# Patient Record
Sex: Male | Born: 1935
Health system: Southern US, Community
[De-identification: ages and names within clinical notes are randomized; demographics above are authoritative.]

## PROBLEM LIST (undated history)

## (undated) DIAGNOSIS — F0390 Unspecified dementia without behavioral disturbance: Secondary | ICD-10-CM

## (undated) DIAGNOSIS — K219 Gastro-esophageal reflux disease without esophagitis: Secondary | ICD-10-CM

## (undated) DIAGNOSIS — M353 Polymyalgia rheumatica: Secondary | ICD-10-CM

## (undated) DIAGNOSIS — R413 Other amnesia: Secondary | ICD-10-CM

## (undated) DIAGNOSIS — N400 Enlarged prostate without lower urinary tract symptoms: Secondary | ICD-10-CM

## (undated) DIAGNOSIS — M797 Fibromyalgia: Secondary | ICD-10-CM

## (undated) DIAGNOSIS — Z860101 Personal history of adenomatous and serrated colon polyps: Secondary | ICD-10-CM

## (undated) DIAGNOSIS — I4891 Unspecified atrial fibrillation: Secondary | ICD-10-CM

## (undated) DIAGNOSIS — Z86718 Personal history of other venous thrombosis and embolism: Secondary | ICD-10-CM

## (undated) DIAGNOSIS — Z79899 Other long term (current) drug therapy: Secondary | ICD-10-CM

## (undated) DIAGNOSIS — C679 Malignant neoplasm of bladder, unspecified: Secondary | ICD-10-CM

## (undated) DIAGNOSIS — E785 Hyperlipidemia, unspecified: Secondary | ICD-10-CM

## (undated) DIAGNOSIS — R4701 Aphasia: Secondary | ICD-10-CM

## (undated) DIAGNOSIS — E039 Hypothyroidism, unspecified: Secondary | ICD-10-CM

## (undated) DIAGNOSIS — K5900 Constipation, unspecified: Secondary | ICD-10-CM

## (undated) DIAGNOSIS — N401 Enlarged prostate with lower urinary tract symptoms: Secondary | ICD-10-CM

## (undated) DIAGNOSIS — I2699 Other pulmonary embolism without acute cor pulmonale: Secondary | ICD-10-CM

## (undated) DIAGNOSIS — Z86010 Personal history of colonic polyps: Secondary | ICD-10-CM

## (undated) DIAGNOSIS — D649 Anemia, unspecified: Secondary | ICD-10-CM

## (undated) DIAGNOSIS — R519 Headache, unspecified: Secondary | ICD-10-CM

## (undated) HISTORY — DX: Other long term (current) drug therapy: Z79.899

## (undated) HISTORY — DX: Benign prostatic hyperplasia with lower urinary tract symptoms: N40.1

## (undated) HISTORY — DX: Polymyalgia rheumatica: M35.3

## (undated) HISTORY — DX: Other amnesia: R41.3

## (undated) HISTORY — DX: Headache, unspecified: R51.9

## (undated) HISTORY — DX: Personal history of colonic polyps: Z86.010

## (undated) HISTORY — DX: Unspecified atrial fibrillation: I48.91

## (undated) HISTORY — DX: Anemia, unspecified: D64.9

## (undated) HISTORY — DX: Benign prostatic hyperplasia without lower urinary tract symptoms: N40.0

## (undated) HISTORY — DX: Constipation, unspecified: K59.00

## (undated) HISTORY — DX: Unspecified dementia, unspecified severity, without behavioral disturbance, psychotic disturbance, mood disturbance, and anxiety: F03.90

## (undated) HISTORY — DX: Hyperlipidemia, unspecified: E78.5

## (undated) HISTORY — DX: Personal history of adenomatous and serrated colon polyps: Z86.0101

## (undated) HISTORY — DX: Malignant neoplasm of bladder, unspecified: C67.9

## (undated) HISTORY — DX: Gastro-esophageal reflux disease without esophagitis: K21.9

## (undated) HISTORY — PX: IVC FILTER INSERTION: CATH118245

## (undated) HISTORY — PX: TRANSURETHRAL RESECTION OF BLADDER TUMOR WITH GYRUS (TURBT-GYRUS): SHX6458

---

## 1998-02-02 ENCOUNTER — Ambulatory Visit (HOSPITAL_COMMUNITY): Admission: RE | Admit: 1998-02-02 | Discharge: 1998-02-02 | Payer: Self-pay | Admitting: Gastroenterology

## 2000-02-07 ENCOUNTER — Encounter (INDEPENDENT_AMBULATORY_CARE_PROVIDER_SITE_OTHER): Payer: Self-pay | Admitting: Specialist

## 2000-02-07 ENCOUNTER — Other Ambulatory Visit: Admission: RE | Admit: 2000-02-07 | Discharge: 2000-02-07 | Payer: Self-pay | Admitting: Urology

## 2001-07-09 ENCOUNTER — Encounter: Payer: Self-pay | Admitting: Emergency Medicine

## 2001-07-09 ENCOUNTER — Encounter: Admission: RE | Admit: 2001-07-09 | Discharge: 2001-07-09 | Payer: Self-pay | Admitting: Emergency Medicine

## 2001-09-06 ENCOUNTER — Observation Stay (HOSPITAL_COMMUNITY): Admission: RE | Admit: 2001-09-06 | Discharge: 2001-09-06 | Payer: Self-pay | Admitting: Urology

## 2001-09-06 ENCOUNTER — Encounter (INDEPENDENT_AMBULATORY_CARE_PROVIDER_SITE_OTHER): Payer: Self-pay | Admitting: Specialist

## 2002-04-18 HISTORY — PX: ACHILLES TENDON REPAIR: SUR1153

## 2002-04-24 ENCOUNTER — Ambulatory Visit (HOSPITAL_BASED_OUTPATIENT_CLINIC_OR_DEPARTMENT_OTHER): Admission: RE | Admit: 2002-04-24 | Discharge: 2002-04-24 | Payer: Self-pay | Admitting: Orthopedic Surgery

## 2002-06-03 ENCOUNTER — Encounter: Admission: RE | Admit: 2002-06-03 | Discharge: 2002-06-03 | Payer: Self-pay

## 2002-06-04 ENCOUNTER — Inpatient Hospital Stay (HOSPITAL_COMMUNITY): Admission: EM | Admit: 2002-06-04 | Discharge: 2002-06-11 | Payer: Self-pay | Admitting: Orthopaedic Surgery

## 2002-06-24 ENCOUNTER — Inpatient Hospital Stay (HOSPITAL_COMMUNITY): Admission: AD | Admit: 2002-06-24 | Discharge: 2002-06-28 | Payer: Self-pay | Admitting: Vascular Surgery

## 2002-06-27 ENCOUNTER — Encounter (INDEPENDENT_AMBULATORY_CARE_PROVIDER_SITE_OTHER): Payer: Self-pay | Admitting: Specialist

## 2003-02-02 ENCOUNTER — Emergency Department (HOSPITAL_COMMUNITY): Admission: EM | Admit: 2003-02-02 | Discharge: 2003-02-02 | Payer: Self-pay | Admitting: Emergency Medicine

## 2006-03-06 ENCOUNTER — Inpatient Hospital Stay (HOSPITAL_COMMUNITY): Admission: EM | Admit: 2006-03-06 | Discharge: 2006-03-14 | Payer: Self-pay | Admitting: Emergency Medicine

## 2006-03-07 ENCOUNTER — Encounter: Payer: Self-pay | Admitting: Vascular Surgery

## 2006-03-07 ENCOUNTER — Encounter (INDEPENDENT_AMBULATORY_CARE_PROVIDER_SITE_OTHER): Payer: Self-pay | Admitting: Cardiology

## 2008-03-20 ENCOUNTER — Encounter: Admission: RE | Admit: 2008-03-20 | Discharge: 2008-03-20 | Payer: Self-pay | Admitting: Family Medicine

## 2010-03-16 ENCOUNTER — Ambulatory Visit (HOSPITAL_BASED_OUTPATIENT_CLINIC_OR_DEPARTMENT_OTHER): Admission: RE | Admit: 2010-03-16 | Discharge: 2010-03-16 | Payer: Self-pay | Admitting: Urology

## 2010-05-09 ENCOUNTER — Encounter: Payer: Self-pay | Admitting: Cardiology

## 2010-05-13 ENCOUNTER — Other Ambulatory Visit: Payer: Self-pay | Admitting: Gastroenterology

## 2010-09-03 NOTE — Discharge Summary (Signed)
NAME:  Gilbert Clark, Gilbert Clark                    ACCOUNT NO.:  [id number]   MEDICAL RECORD NO.:  [id number]                   PATIENT TYPE:  INP   LOCATION:  0443                                 FACILITY:  [facility]   PHYSICIAN:  Mark C. Ophelia Charter, M.D.                 DATE OF BIRTH:  26-Feb-1936   DATE OF ADMISSION:  06/04/2002  DATE OF DISCHARGE:  06/11/2002                                 DISCHARGE SUMMARY   FINAL DIAGNOSES:  1. Lower extremity venous thrombosis.  2. History of bladder malignancy.  3. Hypothyroidism and OS.  4. Hypercholesterolemia.   HISTORY OF PRESENT ILLNESS:  A 75 year old white male was admitted to [facility] June 04, 2002 after being seen in our office with  complaints of left lower extremity pain and swelling.  He was status post  Achilles tendon repair in January 2004 by Nadara Mustard, M.D.  Pain and  swelling progressively worsened over the past several weeks prior to  admission.  Venous Doppler showed a thrombus from iliac through CFV, SFV,  popliteal, and tibial veins.   ADMISSION LABORATORIES:  PT 13.6, INR 1.0, PTT 30.  Na 138, K 5.1, Cl 107,  CO2 22, glucose 129, BUN 28, creatinine 1.4, calcium 10.1, TP 7.4, albumin  3.5, AST 23, ALT 22, ALP 83, Tbili 0.7.  Urinalysis showed a few bacteria  with granular casts.   HOSPITAL COURSE:  On June 04, 2002 patient was admitted to [facility] and started on heparin and Coumadin pharmacy protocol.  He was seen  by his neurologist to discuss pending neurology procedure.  June 05, 2002 patient had good pain control.  Vital signs stable, afebrile.  WBC  12.9, hemoglobin 13.4.  PTT 103, PT 13.7, INR 1.0.  Started on Flexeril 10  mg p.o. t.i.d. p.r.n. for spasms.  Evaluated by care management.  June 06, 2002 good pain control.  Vital signs stable, afebrile.  PTT 87, PT 13.6,  INR 1.0.  Left lower extremity swelling and firmness decreased.  DP pulses  palpable.  Orders given for  nonweightbearing left lower extremity.  June 07, 2002 swelling and pain continue to decrease.  June 08, 2002 vital  signs stable, afebrile.  INR 1.3, PTT 107.  Calf and thigh soft.  June 09, 2002 INR 1.8.  Continue heparin and Coumadin until INR greater than 2.0.  June 10, 2002 patient doing well.  Continues to improve and ready for  discharge June 11, 2002.   DISPOSITION:  Discharged home June 11, 2002.   DISCHARGE MEDICATIONS:  1. Coumadin 10 mg p.o. daily.  2. Resume previous home medications.   CONDITION ON DISCHARGE:  Stable and improved.    DISCHARGE INSTRUCTIONS:  The patient will continue to keep left lower  extremity elevated as much as possible.  He will wear a fracture boot while  he is up and ambulating on crutches.  Touchdown weightbearing.  Also, follow  up in one week for a recheck.  Repeat Doppler in two weeks.  Will remain on  Coumadin for DVT for the next several weeks.     Genene Churn. Denton Meek.                      Mark C. Ophelia Charter, M.D.    JMO/MEDQ  D:  07/30/2002  T:  07/30/2002  Job:  [id number]

## 2010-09-03 NOTE — Op Note (Signed)
[facility]  Patient:    [name], Gilbert Clark Visit Number: [id number] MRN: [id number]          Service Type: OBV Location: 3W 0343 01 Attending Physician:  Evlyn Clines Dictated by:   Excell Seltzer. Annabell Howells, M.D. Proc. Date: 09/06/01 Admit Date:  09/06/2001 Discharge Date: 09/06/2001   CC:         Reuben Likes, M.D.   Operative Report  PREOPERATIVE DIAGNOSIS:  Bladder tumor of the posterior wall measuring 4 cm.  POSTOPERATIVE DIAGNOSIS:  Bladder tumor of the posterior wall measuring 4 cm.  PROCEDURE:  Transurethral resection of bladder tumor with instillation of mitomycin C.  SURGEON:  John J. Annabell Howells, M.D.  ANESTHESIA:  General.  DRAINS:  A 22-French Foley.  SPECIMENS:  Tumor.  COMPLICATIONS:  None.  INDICATIONS:  The patient is a 75 year old white male who had an episode of gross hematuria.  He has had a long history of microhematuria with previous negative evaluation.  With the gross hematuria episode, he underwent a repeat evaluation without IVP and cystoscopy, and this demonstrated on the posterior wall of the bladder, a papillary transitional cell carcinoma with probable carcinoma in situ, measuring approximately 4 cm in greatest diameter.  FINDINGS AND DESCRIPTION OF PROCEDURE:  The patient was given p.o. Tequin.  He was taken to the operating room where a general anesthetic was induced.  He was placed in the lithotomy position.  His perineum and genitalia were prepped with Betadine solution and he was draped in the usual sterile fashion.  The urethra was calibrated to 30-French with Sissy Hoff sounds and a 28-French continuous flow resectoscope sheath was inserted.  This was initially fitted with a 70 degree lens.  Inspection revealed mild trabeculation of the bladder, a tumor on the posterior wall.  The ureteral orifices were well-away from from the tumor in the normal anatomic position.  The 70-French scope was then replaced  with an Wandra Scot handle 12 degree lens and 26 loupe.  Inspection of the prostate revealed bilobar hyperplasia with some elevation in the median bar with coaptation and some degree of obstruction.  The tumor was once again identified and photographed, and then resected. with pure cut.  Once the entire tumor and surrounding erythema suspicious for carcinoma in situ had been resected, the bladder was evacuated free of chips.  The base of the tumor was then fulgurated as well as surrounding mucosa for approximately 1 cm.  At this point, the bladder was reinspected, and there was no evidence of bladder wall perforation or ureteral injury noted.  The bladder was free of chips and clots.  No active bleeding was noted.  The scope was removed and a 22-French Foley catheter was inserted.  The balloon was filled with 10 cc of sterile fluid.  The bladder was drained and then 40 cc of solution containing 40 mg of mitomycin C were instilled.  The catheter was plugged.  It will remain plugged for 30 minutes.  The patient was then taken down from the lithotomy position and his anesthetic was reversed.  He was moved to the recovery room in stable condition.  There were no complications. Dictated by:   Excell Seltzer. Annabell Howells, M.D. Attending Physician:  Evlyn Clines DD:  09/06/01 TD:  09/09/01 Job: [id number]

## 2010-09-03 NOTE — Discharge Summary (Signed)
NAME:  Gilbert Clark, Gilbert Clark NO.:  [id number]   MEDICAL RECORD NO.:  [id number]                   PATIENT TYPE:  OBV   LOCATION:  0373                                 FACILITY:  [facility]   PHYSICIAN:  Excell Seltzer. Annabell Howells, M.D.                 DATE OF BIRTH:  [birth year]   DATE OF ADMISSION:  06/27/2002  DATE OF DISCHARGE:  06/28/2002                                 DISCHARGE SUMMARY   DATE OF ADMISSION:  The patient was admitted on June 24, 2002 to Dr. Josephina Gip and transferred to [facility] on June 27, 2002.   DISCHARGE DATE:  Patient discharged home on June 28, 2002.   HISTORY OF PRESENT ILLNESS:  Briefly, the patient is a 75 year old white  male who has a history of bladder tumors and has had a recurrence.  His  condition has been complicated by a left deep venous thrombosis following a  ruptured tendon.  He had been on Coumadin therapy but was admitted for a  vena cava filter placement by Dr. Jerilee Field to be followed by a  transurethral resection of bladder tumor. For additional details of the  history and physical examination please see the note dictated by Dr. Hart Rochester  on June 24, 2002.   HOSPITAL COURSE:  On the day of his admission to the hospital, he was taken  off his Coumadin and put on heparin protocol and on June 26, 2002 was taken  to the operating room where Dr. Hart Rochester did vena cava filter placement.  On  June 27, 2002 he was transferred to [facility] where he underwent  cystoscopy, bilateral retrograde pyelograms, transurethral resection of  median bladder tumor.  Postoperatively the patient did well, was left with a  Foley catheter.  The Foley catheter was removed on June 28, 2002.  He was  able to void without difficulty and was felt to be ready for discharge home.  He was discharged home with the following discharge diagnoses:   DISCHARGE DIAGNOSES:  1. Transitional cell carcinoma of the bladder.  2. Deep venous  thrombosis requiring vena cava filter.   COMPLICATIONS:  There were no complications during the patient's admission.   FINAL PATHOLOGY:  Low grade, noninvasive papillary transitional cell  carcinoma.   DISPOSITION:  The patient is discharged to home.   CONDITION ON DISCHARGE:  His condition on discharge is improved.   PROGNOSIS:  Good.    DISCHARGE INSTRUCTIONS:  The patient was instructed to follow up with Dr.  Annabell Howells in one to two weeks.   DISCHARGE MEDICATIONS:  Patient is discharged home with prescriptions for  Vicodin and Bactrim.  Excell Seltzer. Annabell Howells, M.D.    JJW/MEDQ  D:  08/22/2002  T:  08/22/2002  Job:  [id number]   cc:   Quita Skye. Hart Rochester, M.D.  [address]  [place]  [place] [address]  Fax: [phone]

## 2010-09-03 NOTE — H&P (Signed)
NAME:  Gilbert Clark, Raden.:  [id number]   MEDICAL RECORD NO.:  [id number]                   PATIENT TYPE:  INP   LOCATION:                                       FACILITY:  [facility]   PHYSICIAN:  Quita Skye. Hart Rochester, M.D.               DATE OF BIRTH:  [birth year]   DATE OF ADMISSION:  06/24/2002  DATE OF DISCHARGE:                                HISTORY & PHYSICAL   CHIEF COMPLAINT:  Recent deep venous thrombosis left leg that now requires  bladder surgery.  Admitted for vena cava filter.   HISTORY OF PRESENT ILLNESS:  This 75 year old gentleman underwent repair of  a ruptured Achilles tendon by Dr. Aldean Baker about eight weeks ago.  On  February 17 he developed a swollen left thigh and calf and was found to have  acute deep venous thrombosis by duplex scanning involving his left femoral,  superficial femoral, popliteal and tibial veins.  He was admitted for  heparinization and converted to Coumadin therapy.  He has been taking  Coumadin at home with slow improvement in the edema of the left leg since  that time.  Most recently he was taking 10 mg per day with an INR of 3.7 and  a prothrombin time of 30 seconds yesterday (March 1).  He had an episode of  right sided chest pain prior to his diagnosis of deep venous thrombosis and  a question of whether he could have had a right sided pulmonary embolus was  entertained although he had no tests to document this.  He now needs  cystoscopy and surgery for a localized bladder cancer by Dr. Bjorn Pippin and  is admitted for insertion of vena caval filter.   PAST MEDICAL HISTORY:  Negative for coronary artery disease, CVA, diabetes  mellitus, deep venous thrombosis in the past, chronic obstructive pulmonary  disease.   PAST SURGICAL HISTORY:  Achilles tendon repair, his previous cystoscopy for  bladder tumor removal, and liposuction surgery.   FAMILY HISTORY:  Negative for coronary artery disease, positive for  stroke.   SOCIAL HISTORY:  The patient does not smoke, has not for the past 35 years.   MEDICATIONS:  1. Zocor 40 mg daily.  2. Synthroid 100 mcg p.o. daily.  3. Coumadin 10 mg daily, now decreased to 5 mg daily.  4. Vitamin C 500 mg p.o. daily.  5. Multivitamin one p.o. daily.  6. Folic acid 44 mg p.o. daily.  7. Benadryl 25 mg p.o. q.h.s.   ALLERGIES:  EPINEPHRINE.   REVIEW OF SYSTEMS:  Denies any GI, GU, lower extremity or neurologic  symptoms.  No history of pulmonary problems such as emphysema, asthma,  wheezing, dyspnea on exertion, PND, orthopnea or other cardiac symptoms.   PHYSICAL EXAMINATION:  VITALS:  Blood pressure is 122/72, heart rate 80,  respirations 16.  GENERAL:  Healthy appearing male patient who is in no apparent distress.  He  is alert and oriented x3.  NECK:  Supple, 3+ carotid pulses.  No bruits are audible.  NEUROLOGIC:  Normal.  CHEST:  Clear to auscultation.  CARDIOVASCULAR:  Reveals regular rhythm, no murmurs.  ABDOMEN:  Soft, nontender with no masses.  EXTREMITIES:  He has 3+ femoral and popliteal pulses on the right with a 2+  dorsalis pedis pulse.  The left leg has a 3+ femoral pulse.  He has a soft  cast on from the knee to the ankle on the left.  Left thigh is slightly  swollen compared to the right.   IMPRESSION:  1. Recent episode of acute ileofemoral deep venous thrombosis on the left.  2. Recent repair of ruptured Achilles tendon.  3. History of bladder cancer - recurrence.   PLAN:  Will admit on Monday, March 8 after two days off of Coumadin therapy  and convert to IV heparin and when Coumadin has been reversed proceed with  insertion of inferior vena caval filter in preparation for his bladder  surgery.                                               Quita Skye Hart Rochester, M.D.    JDL/MEDQ  D:  06/18/2002  T:  06/18/2002  Job:  [id number]   cc:   Maye Hides, M.D.   Nadara Mustard, M.D.  [address]  [place]  [place]  [address]  Fax: [phone]   Excell Seltzer. Annabell Howells, M.D.  [address]  [place]  [place] [address]  Fax: [phone]

## 2010-09-03 NOTE — Op Note (Signed)
   NAME:  Gilbert Clark, Gilbert Clark                    ACCOUNT NO.:  [id number]   MEDICAL RECORD NO.:  [id number]                   PATIENT TYPE:  AMB   LOCATION:  DAY                                  FACILITY:  [facility]   PHYSICIAN:  Quita Skye. Hart Rochester, M.D.               DATE OF BIRTH:  22-Sep-1935   DATE OF PROCEDURE:  06/26/2002  DATE OF DISCHARGE:                                 OPERATIVE REPORT   PREOPERATIVE DIAGNOSIS:  Left leg iliofemoral deep venous thrombosis with  need for bladder surgery.   POSTOPERATIVE DIAGNOSIS:  Left leg iliofemoral deep venous thrombosis with  need for bladder surgery.   OPERATION/PROCEDURE:  Insertion of a Cordis trapeze inferior vena caval  filter via right common femoral approach.   SURGEON:  Quita Skye. Hart Rochester, M.D.   ANESTHESIA:  Local Xylocaine, Versed 2 mg intravenously.   COMPLICATIONS:  None.   DESCRIPTION OF PROCEDURE:  The patient was taken to the [facility]  Peripheral Vascular Lab, placed in the supine position at which time the  right groin was prepped with Betadine solution and draped in the routine  sterile manner.  After infiltration with 1% Xylocaine, the right common  femoral vein was entered percutaneously, guide wire passed into the inferior  vena cava via fluoroscopic guidance.  The Cordis trapeze vena cava filter  device was then inserted over the guide wire with the dilator within the  sheath being passed to the distal inferior vena cava.  The dilator was  removed.  Inferior vena cavogram was performed injecting 35 cc of contrast  at 18 cc/second.  This revealed the location of the renal veins at about the  L1 interspace and this had been marked with a hemostat.  The diameter of the  inferior vena cava below the renal veins was approximately 18 mm.  The  dilator was then reinserted over the guide wire and the sheath advanced to  the appropriate position at the L2 interspace and the trapeze inferior vena  cava filter was then  positioned appropriately.  The sheath was then  retracted deploying the filter in the appropriate position which was  confirmed fluoroscopically.  The sheath and obturator were then removed.  Adequate compression applied.  No complications ensured.   CONCLUSION:  Successful placement of trapeze inferior vena caval filter at  the L2 interspace via right common femoral vein approach.                                               Quita Skye Hart Rochester, M.D.    JDL/MEDQ  D:  06/26/2002  T:  06/26/2002  Job:  [id number]

## 2010-09-03 NOTE — Discharge Summary (Signed)
NAME:  Gilbert Clark, Gilbert Clark NO.:  [id number]   MEDICAL RECORD NO.:  [id number]          PATIENT TYPE:  INP   LOCATION:  2020                         FACILITY:  [facility]   PHYSICIAN:  Madaline Savage, M.D.DATE OF BIRTH:  06/02/1935   DATE OF ADMISSION:  03/06/2006  DATE OF DISCHARGE:  03/14/2006                                 DISCHARGE SUMMARY   DISCHARGE DIAGNOSES:  1. Deep vein thrombosis this admission, treated with thrombectomy and      thrombolytics on March 08, 2006.  2. Blood loss anemia this admission, transfused two units on March 11, 2006.  3. Contrast nephropathy with a peak creatinine of 2.4.  Creatinine 2.2 at      discharge.  4. History of bladder cancer, followed by Dr. Annabell Howells.  5. Previous deep vein thrombosis in 2004, with a history of an IVC filter      placement.  6. Hypothyroidism, treated, with an adjustment his Synthroid this      admission.   HOSPITAL COURSE:  The patient is a 75 year old male followed by Dr. Elsie Lincoln  and Dr. Annabell Howells.  He has a history of DVT, when he had bladder cancer in 2004.  At that time, he was also given an IVC filter.  The patient's bladder cancer  has been stable, apparently he had a cystoscopy in September 2007, that was  okay.  His PSA is slightly elevated but Dr. Annabell Howells feels this is stable.  The  patient was admitted March 06, 2006, with a 2-week history of pain in his  legs and swelling.  He went to [facility] for a CT and  the scan revealed an extensive thrombus of the femoral vein, iliac veins,  and IVC.  Chest CT did not reveal pulmonary embolism.  The patient was  admitted to telemetry, started on IV heparin.  His creatinine on admission  was 1.4.  Dr. Elsie Lincoln reviewed the CT scan of the abdomen and pelvis.  The  [place] filter that had previously been placed was completely filled with  thrombus and almost totally occluded.  After discussion with Dr. Alanda Amass  and Dr. Allyson Sabal, we  asked interventional radiology to see the patient and on  March 08, 2006, he underwent lytic therapy and thrombectomy.  He was kept  on __________  for 24 hours.  The patient was also seen by Dr. Annabell Howells, who  felt that the DVTs were not likely related to a urologic issue.  Dr. Annabell Howells  will see him seen in followup as an outpatient.  The patient did have a bump  in his creatinine post-op to 2.4 peak, at discharge, this is 2.2.  Dr.  Elsie Lincoln felt this was probably secondary to contrast used during the  venogram.  He did drop his hemoglobin postprocedure to 8.2 and was  transfused 2 units packed RBCs.  Echocardiogram done during this admission  revealed normal LV function with an EF of 60%.  Right ventricle was at upper  limits of normal.  Right atrium size was normal.  Coumadin was started.  Dr.  Elsie Lincoln feels the patient can  be discharged March 14, 2006.   Coumadin dose at discharge will be 12.5 mg a day.  He will have daily  proTime checks.   He is to see Dr. Elsie Lincoln in the office Thursday of this week.   1. He will go home on Lovenox 100 mg subcu q.12 h.  2. Other medications at discharge include Zantac 150 mg a day.  3. Vytorin 10/80 every day.  4. Lexapro 10 mg a day.  5. Flomax 0.4 mg a day.  6. Synthroid has been increased to 0.137 mg a day.  7. Aspirin 81 mg a day.  8. Iron b.i.d.  9. Fish oil and multivitamin once a day.   LABORATORY:  White count 6, hemoglobin 9.6, hematocrit 27.6, platelets 170.  Sodium 137, potassium 3.5, BUN 12, creatinine 2.2, calcium 8.3.  Fibrinogen  311.  Blood type is B+, antibody negative.  Fibrinogen level is 152 on the  22nd.  PSA was 4.58.  Antithrombin III was 116 which is within normal  limits.  Venous study done March 07, 2006, showed chronic nonobstructive  DVT in the profunda vein on the right.  No evidence of DVT in the left,  although the distal common femoral vein may have thickened walls.  A UA was  negative.  INR at discharge is  1.4.  Telemetry shows sinus rhythm.  EKG  shows sinus rhythm, poor anterior R-wave progression, and septal Qs.   DISPOSITION:  The patient is discharged in stable condition.  He will have  daily proTime checks.  He is to see Dr. Elsie Lincoln later this week on Thursday.  He will need a followup TSH as his Synthroid was increased this admission.  His TSH on the 19th, was 11.48.      Abelino Derrick, P.A.    ______________________________  Madaline Savage, M.D.    Lenard Lance  D:  03/14/2006  T:  03/14/2006  Job:  [id number]   cc:   Pam Drown, M.D.  Excell Seltzer. Annabell Howells, M.D.

## 2010-09-03 NOTE — H&P (Signed)
NAME:  Gilbert Clark, Gilbert Clark                    ACCOUNT NO.:  [id number]   MEDICAL RECORD NO.:  [id number]                   PATIENT TYPE:  INP   LOCATION:  0443                                 FACILITY:  [facility]   PHYSICIAN:  Mark C. Ophelia Charter, M.D.                 DATE OF BIRTH:  05/30/1935   DATE OF ADMISSION:  06/04/2002  DATE OF DISCHARGE:                                HISTORY & PHYSICAL   CHIEF COMPLAINT:  Left lower extremity pain and swelling.   HISTORY OF PRESENT ILLNESS:  The patient is a 75 year old white male who is  admitted to [name] for left lower extremity DVT, pain, and swelling.  He is status post Achilles tendon repair January 2004 by  Dr. Lajoyce Corners.  Over  the past several days, he has had an increased pain and swelling from the  upper thigh down to his left foot.  Increased pain with ambulation.  The  patient denies any previous history of blood clot.   CURRENT MEDICATIONS:  1. Zocor 40 mg p.o. once daily.  2. Synthroid 100 mcg p.o. once daily.  3. Vitamin C 500 mg p.o. once daily.  4. ASA 81 mg p.o. once daily.  5. Folic acid 44 mg p.o. once daily.  6. Benadryl 25 mg p.o. at bedtime p.r.n. for sleep.  7. Multivitamin one tab p.o. once daily.   ALLERGY:  EPINEPHRINE.   PAST MEDICAL/SURGICAL HISTORY:  1. Liposuction May 2000.  2. Left Achilles tendon repair January 2004.  3. Bladder excisional biopsy May 2003.  4. Hypercholesterolemia.  5. Hypothyroidism.  6. History of bladder cancer.  7. BPH/prostatitis.  8. Recent history of pleurisy x2 weeks ago treated with a 10-day course of     antibiotics.   SOCIAL HISTORY:  The patient is married.  Quit smoking x35 years ago.  Admits to occasional ETOH.   FAMILY HISTORY:  Positive for CVA.   REVIEW OF SYSTEMS:  Positive nausea.  Positive nonproductive cough.  Currently denies chest pain or shortness of breath, abdominal pain, fever,  chills, night sweats, bleeding disorders, seizure disorders.  All other  systems noncontributory.   PHYSICAL EXAMINATION:  VITAL SIGNS:  Temperature 96.8, pulse 84,  respirations 20, blood pressure 110/73, height 73 inches, weight 220 pounds.  GENERAL:  Pleasant white male who is alert and oriented x3 in no acute  distress.  HEENT:  Head is normocephalic, atraumatic.  PERRLA and EOMI.  Oral mucosa  pink and moist.  NECK:  Full range of motion.  Supple, nontender.  Carotids are intact.  No  lymphadenopathy.  LUNGS:  CTA bilaterally.  No wheezes, rales, or rhonchi noted.  HEART:  RRR.  S1, S2.  No murmurs, rubs, or gallops noted.  ABDOMEN:  Round, nondistended.  NABS x4.  Soft, nontender.  No palpable  masses or organomegaly.  GENITOURINARY:  Not examined.  EXTREMITIES:  On exam of the left lower extremity swelling  noted from the  upper thigh down to the foot.  The leg is tender to palpation.  Blue  discoloration.  Pedal pulses are nonpalpable secondary to swelling.  Pain  with attempted range of motion.  Right lower extremity within normal limits.  Pedal pulse intact.   DIAGNOSTIC STUDIES:  Venous Doppler showed a thrombus from iliac through  CFV, SFV, popliteal and tibial veins.  Chest x-ray from 06/03/02 showed  opacity at the right base consistent with atelectasis and pneumonia.   LABORATORIES:  Pending.   ASSESSMENT:  Left lower extremity deep vein thrombosis, pain, and swelling.   PLAN:  The patient admitted and started on heparin and Coumadin.  He will  remain on heparin until Coumadin is therapeutic.  Left lower extremity  elevated above heart level.  The patient will be on bed rest.  He was  started on IV and p.o. pain medications.  He will remain on previous home  meds.  EKG ordered along with UA.  We will order a sputum Gram stain and  C&S.     Genene Churn. Denton Meek.                      Mark C. Ophelia Charter, M.D.    JMO/MEDQ  D:  06/04/2002  T:  06/04/2002  Job:  [id number]

## 2010-09-03 NOTE — Op Note (Signed)
NAME:  Gilbert Clark, Gilbert Clark                    ACCOUNT NO.:  [id number]   MEDICAL RECORD NO.:  [id number]                   PATIENT TYPE:  AMB   LOCATION:  DAY                                  FACILITY:  [facility]   PHYSICIAN:  Excell Seltzer. Annabell Howells, M.D.                 DATE OF BIRTH:  Nov 24, 1935   DATE OF PROCEDURE:  06/27/2002  DATE OF DISCHARGE:                                 OPERATIVE REPORT   PREOPERATIVE DIAGNOSIS:  Bladder cancer.   POSTOPERATIVE DIAGNOSIS:  Bladder cancer.   PROCEDURES:  1. Cystoscopy.  2. Bilateral retrograde pyelography with interpretation.  3. Transurethral resection of medium bladder tumor.   SURGEON:  Excell Seltzer. Annabell Howells, M.D.   ASSISTANT:  Crecencio Mc, MD   ANESTHESIA:  General.   COMPLICATIONS:  None.   INDICATION:  Gilbert Clark is a 75 year old white male with a history of  superficial transitional cell carcinoma of the bladder.  The patient was  recently found to have a recurrent tumor on surveillance cystoscopy.  This  was noted to be a papillary lesion, just lateral to the right ureteral  orifice.  Of concern, the patient was on Coumadin due to a right lower  extremity DVT.  Therefore, an inferior vena cava filter was placed prior to  his procedure so that he could come off the Coumadin for the procedure.  Potential risks and benefits of the procedure were explained to the patient,  and he consented.   DESCRIPTION OF PROCEDURE:  The patient was taken to the operating room, and  a general anesthetic was administered.  The patient was given preoperative  antibiotics, placed in the dorsal lithotomy position, and draped in the  usual sterile fashion.  Next, cystoscopy was performed with the 12 and 70  degree lenses.  This demonstrated the ureteral orifices to be in the normal  anatomic position and effluxing clear urine.  An approximately 2-3 cm  papillary bladder tumor was noted just lateral to the right ureteral  orifice.  In addition, a small, less  than 1 cm papillary bladder tumor was  noted just behind the larger tumor.  The remainder of the bladder appeared  clear without evidence of any further worrisome areas.  In addition, the  prostatic urethra revealed some frondular projections from the left  prostatic urethra just proximal to the verumontanum.  Attention then turned  to performing retrograde pyelography.  A 6 French end-hole catheter was  placed into the left ureteral orifice, and contrast was injected.  There  appeared to be no evidence of any filling defects or dilation of the  collecting system on this side.  A retrograde pyelogram was then performed  on the right side in a similar fashion.  Again, no filling defects or other  abnormalities were noted.  Attention then turned to resection of the bladder  tumor.  The cystoscope was removed and replaced with a 28 French  resectoscope sheath.  Using the cutting loop, the bladder tumors were  resected.  The ureteral orifice was able to be spared.  Hemostasis was  achieved with the cautery, and the specimens were passed off for permanent  pathologic analysis.  Attention then turned to the prostatic urethra.  Two  swipes through the left side of the prostatic urethra were performed.  This  tissue was passed off as a separate specimen.  Hemostasis was again achieved  with electrocautery.  The resectoscope was then removed, and a 20 [place]  Foley catheter was inserted into the bladder.  The patient appeared to  tolerate her procedure well and without complications.  He was able to be  awakened and transferred to the recovery unit in satisfactory condition.  Please note that Excell Seltzer. Annabell Howells, M.D. was the operating surgeon and was  present and participated in this entire procedure.     Crecencio Mc, M.D.                          Excell Seltzer. Annabell Howells, M.D.    LB/MEDQ  D:  06/27/2002  T:  06/27/2002  Job:  [id number]

## 2010-09-03 NOTE — Op Note (Signed)
NAME:  Gilbert Clark, HEITZENRATER NO.:  [id number]   MEDICAL RECORD NO.:  [id number]                   PATIENT TYPE:  AMB   LOCATION:  DSC                                  FACILITY:  [facility]   PHYSICIAN:  Nadara Mustard, M.D.                DATE OF BIRTH:  06-Dec-1935   DATE OF PROCEDURE:  DATE OF DISCHARGE:                                 OPERATIVE REPORT   PREOPERATIVE DIAGNOSIS:  Left Achilles tendon rupture.   POSTOPERATIVE DIAGNOSIS:  Left Achilles tendon rupture.   PROCEDURE:  Left Achilles tendon reconstruction.   SURGEON:  Dr. Lajoyce Corners.   ANESTHESIA:  General.   ESTIMATED BLOOD LOSS:  Minimal.   ANTIBIOTICS:  One gram of Kefzol.   TOURNIQUET TIME:  Esmarch at the calf for approximately 20 minutes.   DISPOSITION:  To PACU in stable condition.   INDICATIONS FOR PROCEDURE:  This patient is a 75 year old gentleman who  approximately two weeks ago fell down a ladder, sustaining a rupture to his  left Achilles tendon.  The patient was able to ambulate for about ten days;  however, with much difficulty and presented to my office with an Achilles  tendon rupture.  The patient was evaluated and felt to be stable, and  brought to the OR at this time for surgical intervention.  The risks and  benefits were discussed including infection and neurovascular injury,  persistent pain, non-healing of the incision, re-rupture of the tendon.  The  patient states he understands and wishes to proceed at this time.   DESCRIPTION OF PROCEDURE:  The patient was brought to OR room three and  underwent a general anesthetic.  After an adequate level of anesthesia was  obtained, the patient was placed prone on the operating table.  All bony  prominences were padded.  He was checked for any pressure areas and his left  lower extremity was prepped using Duraprep and draped into sterile field,  and Ioban was used to cover all exposed skin.  The foot was elevated and an  Esmarch  was wrapped around the calf for a tourniquet control.  A  posteromedial longitudinal incision was made over the Achilles tendon.  This  was carried down through the peritenon which was reflected.  The patient had  a complete rupture of the Achilles tendon.  Using #2 Ethibond and a Krackow  stitch, two of the  #2 Ethibonds were weaved through the proximal and distal  poles for a total of four sutures exiting the proximal and distal stump.  The foot was placed in plantar flexion and the sutures were tied in mid  substance.  The patient had a good repair, the wound was irrigated.  The  peritenon was then closed using #2-0 Monocryl.  The skin was then closed  with an Allgower stitch with no sutures crossing the skin over the posterior  flap.  This was a with a 2-0  nylon on an SSLX needle.  The wound was then  covered with Adaptic orthopedic sponges and a compressive dressing was  applied.  The patient was transferred supine, extubated and taken to the  PACU in stable condition.  The patient wished to be  discharged to home and was discharged to home in stable condition.  He had a  prescription for Vicodin for pain.  Total tourniquet time was Esmarch at the  calf for approximately 20 minutes.   PLAN:  To follow up in the office in one to two weeks.                                               Nadara Mustard, M.D.    MVD/MEDQ  D:  04/24/2002  T:  04/24/2002  Job:  [id number]

## 2018-04-18 HISTORY — PX: OTHER SURGICAL HISTORY: SHX169

## 2019-12-30 ENCOUNTER — Other Ambulatory Visit: Payer: Self-pay | Admitting: Urology

## 2020-01-15 ENCOUNTER — Encounter (HOSPITAL_BASED_OUTPATIENT_CLINIC_OR_DEPARTMENT_OTHER): Payer: Self-pay | Admitting: Urology

## 2020-01-15 ENCOUNTER — Other Ambulatory Visit: Payer: Self-pay

## 2020-01-15 NOTE — Progress Notes (Signed)
Spoke w/ via phone for pre-op interview--- Lab needs dos----               Lab results------ COVID test 01/17/20 at 0900 Arrive at  0700 NPO after MN NO Solid Food.  Clear liquids from MN until 0600am Medications to take morning of surgery synthroid, flomax  Diabetic medication ----- Patient Special Instructions ----- Pre-Op special Istructions Pt on eliquis- last dose on 01/18/20 per pt to restart on 01/21/2020 per pt  Patient verbalized understanding of instructions that were given at this phone interview. Patient denies shortness of breath, chest pain, fever, cough at this phone interview.

## 2020-01-16 ENCOUNTER — Encounter (HOSPITAL_BASED_OUTPATIENT_CLINIC_OR_DEPARTMENT_OTHER): Payer: Self-pay | Admitting: Urology

## 2020-01-16 NOTE — Progress Notes (Signed)
Anesthesia Review:  PCP:DR Dorthula Perfect at [place] note attached from 01/14/2020 - surgical cleasrance   Cardiologist : Chest x-ray : EKG : 01/14/20 on chart   Echo : Stress test: Cardiac Cath :  Activity level:  Can do a flight of stairs without difficulty  Sleep Study/ CPAP : Fasting Blood Sugar :      / Checks Blood Sugar -- times a day:   Blood Thinner/ Instructions /Last Dose: ASA / Instructions/ Last Dose :  Eliquis last dose on 01/18/2020  Preop instructoins in office visit note and pt aware.   Patient has a wording problem per pt and ov note of DR Mcneill.  She is going to refer him.  Memory problem with workd retrieval issues.

## 2020-01-17 ENCOUNTER — Other Ambulatory Visit (HOSPITAL_COMMUNITY)
Admission: RE | Admit: 2020-01-17 | Discharge: 2020-01-17 | Disposition: A | Discharge status: Home / Self Care | Payer: Medicare Other | Source: Ambulatory Visit | Attending: Urology | Admitting: Urology

## 2020-01-17 DIAGNOSIS — Z20822 Contact with and (suspected) exposure to covid-19: Secondary | ICD-10-CM | POA: Diagnosis not present

## 2020-01-17 DIAGNOSIS — Z01812 Encounter for preprocedural laboratory examination: Secondary | ICD-10-CM | POA: Diagnosis present

## 2020-01-17 LAB — SARS CORONAVIRUS 2 (TAT 6-24 HRS): SARS Coronavirus 2: NEGATIVE

## 2020-01-17 NOTE — Progress Notes (Signed)
Spoke with Gilbert Clark zanetto pa pt meets [facility] guidelines

## 2020-01-20 NOTE — H&P (Signed)
I have bladder cancer that has been treated.  HPI: Gilbert Clark is a 84 year-old male established patient who is here for follow-up of bladder cancer treatment.    [name] returns today in f/u. He had a TURBT in 12/11 for LG NMIBC with 2 small tumors. He was last seen by me in 2014 and has now moved back from [place]. He has had negative annual cystoscopies. He has had no gross hematuria. He has BPH with BOO and has some worsening of the LUTS with an IPSS of 25. He has nocturia which is up to 2x. He has increased hesitancy. He has a reduced stream but empties. He has had no hematuria and only rare hematuria. He is on tamsulosin 2 daily and has been on that for several years. His prostate is 171ml on CT. He is otherwise doing well without associated signs or symptoms.      CC: AUA Questions Scoring.  HPI:     AUA Symptom Score: More than 50% of the time he has the sensation of not emptying his bladder completely when finished urinating. Less than 50% of the time he has to urinate again fewer than two hours after he has finished urinating. Almost always he has to start and stop again several times when he urinates. Less than 50% of the time he finds it difficult to postpone urination. 50% of the time he has a weak urinary stream. More than 50% of the time he has to push or strain to begin urination. He has to get up to urinate 2 times from the time he goes to bed until the time he gets up in the morning.   Calculated AUA Symptom Score: 22    ALLERGIES: Augmentin TABS EPINEPHrine HCl SOLN Sulfa Drugs    MEDICATIONS: Levothyroxine Sodium 137 mcg tablet  Crestor 10 MG Oral Tablet Oral  Eliquis 2.5 mg tablet  Prednisone  Tamsulosin Hcl 0.4 mg capsule 0 Oral     GU PSH: Complex Uroflow - 06/19/2019 Cystoscopy - 01/09/2019, 12/27/2017 Cystoscopy Fulguration - 2008 Cystoscopy TURBT 2-5 cm - 2011 Locm 300-399Mg /Ml Iodine,1Ml - 01/09/2018       PSH Notes: Cystoscopy With Fulguration  Medium Lesion (2-5cm), Cystoscopy With Fulguration, Primary Repair Of Ruptured Achilles Tendon, Bladder Surgery   NON-GU PSH: Cataract surgery Repair Achilles Tendon - 2008     GU PMH: BPH w/LUTS, He remains on tamsulosin but never took the finasteride. He seems to be having some memory issues and I will not resend the finasteride since that can aggravate dementia. He has some improvement in the LUTS but has a moderate PVR of 29ml. I will recheck that in 6 months but don't think we need to change therapy at this time. - 06/19/2019, Benign prostatic hyperplasia with urinary obstruction, - 2014 Incomplete bladder emptying - 06/19/2019 Weak Urinary Stream, PF is 66ml/sec. - 06/19/2019 Microscopic hematuria, He has 10-20 RBC's. I will get a cytology and see when he last had upper tract imaging. he may need a CT. - 12/27/2017 Nocturia - 12/27/2017 ED due to arterial insufficiency, Erectile dysfunction due to arterial insufficiency - 2014 Elevated PSA, Elevated prostate specific antigen (PSA) - 2014 History of bladder cancer, Bladder Cancer - 2014 Other microscopic hematuria, Microscopic Hematuria - 2014      PMH Notes:  2006-07-27 12:40:16 - Note: Venous Thrombosis   NON-GU PMH: Personal history of other diseases of the digestive system, History of esophageal reflux - 2014 Personal history of other endocrine, nutritional and metabolic  disease, History of hypercholesterolemia - 2014, History of hypothyroidism, - 2014 Personal history of other infectious and parasitic diseases, History of herpes zoster - 2014 DVT, History Encounter for general adult medical examination without abnormal findings, Encounter for preventive health examination Fibromyalgia    FAMILY HISTORY: None   SOCIAL HISTORY: Marital Status: Single Preferred Language: English; Race: White Current Smoking Status: Patient does not smoke anymore.   Tobacco Use Assessment Completed: Used Tobacco in last 30 days? Drinks 1 drink per  day.  Drinks 3 caffeinated drinks per day.     Notes: Marital History - Widowed, Death In The Family Father, Death In The Family Mother, Occupation:, Tobacco Use, Alcohol Use, Caffeine Use   REVIEW OF SYSTEMS:    GU Review Male:   Patient reports frequent urination, hard to postpone urination, burning/ pain with urination, get up at night to urinate, stream starts and stops, trouble starting your stream, and have to strain to urinate . Patient denies leakage of urine, erection problems, and penile pain.  Gastrointestinal (Upper):   Patient reports indigestion/ heartburn. Patient denies nausea and vomiting.  Gastrointestinal (Lower):   Patient reports constipation. Patient denies diarrhea.  Constitutional:   Patient denies fever, night sweats, weight loss, and fatigue.  Skin:   Patient denies skin rash/ lesion and itching.  Eyes:   Patient denies blurred vision and double vision.  Ears/ Nose/ Throat:   Patient denies sore throat and sinus problems.  Hematologic/Lymphatic:   Patient reports easy bruising. Patient denies swollen glands.  Cardiovascular:   Patient denies leg swelling and chest pains.  Respiratory:   Patient denies cough and shortness of breath.  Endocrine:   Patient denies excessive thirst.  Musculoskeletal:   Patient denies back pain and joint pain.  Neurological:   He has some progressive issues with memory and loses words. Patient denies headaches and dizziness.  Psychologic:   Patient denies depression and anxiety.   VITAL SIGNS:      12/25/2019 09:51 AM  Weight 199 lb / 90.26 kg  Height 72 in / 182.88 cm  BP 123/69 mmHg  Pulse 69 /min  Temperature 97.5 F / 36.3 C  BMI 27.0 kg/m   Complexity of Data:  Records Review:   AUA Symptom Score, Previous Patient Records  Urine Test Review:   Urinalysis  X-Ray Review: Prostate Ultrasound: Reviewed Films. Discussed With Patient.     02/20/12 01/17/11 08/17/09 05/19/09 02/20/09 02/26/08 01/22/07 07/08/03  PSA  Total PSA  5.63  5.39  4.20  4.75  7.17  3.79  4.14  3.71   Free PSA 1.72  1.52  0.70  0.69  1.55   1.15    % Free PSA 31  28  16.7  14.5  21.6   27.8      PROCEDURES:         Flexible Cystoscopy - 52000  Risks, benefits, and some of the potential complications of the procedure were discussed. 65ml of 2% lidocaine jelly was instilled intraurethrally.  Cipro 500mg  given for antibiotic prophylaxis.     Meatus:  Normal size. Normal location. Normal condition.  Urethra:  No strictures.  External Sphincter:  Normal.  Verumontanum:  Normal.  Prostate:  Obstructing. Moderate hyperplasia.  Bladder Neck:  Non-obstructing.  Ureteral Orifices:  Normal location. Normal size. Normal shape. Effluxed clear urine.  Bladder:  Moderate trabeculation. A left lateral wall tumor. 1/2 cm tumor. Normal mucosa. No stones.      The procedure was well tolerated and there  were no complications.          Prostate Ultrasound - 58099  Length:6.15cm Height:4.88cm Width:6.41cm Volume:100.74ml      Appearance of Hypoechoic Areas in Base measuring 0.72cm and 0.70cm 0.58cm Cystic Area 0.21cm Hyperechoic Area Patient confirmed No Neulasta OnPro Device. I have reviewed the films. No middle lobe seen on [place].   The transrectal ultrasound probe is introduced into the rectum, and the prostate is visualized. Ultrasonography is utilized throughout the procedure. At the conclusion of the procedure, the ultrasound probe is removed. The patient tolerates the procedure without complication.        PVR Ultrasound - 83382  Scanned Volume: 211 cc         Urinalysis w/Scope Dipstick Dipstick Cont'd Micro  Color: Yellow Bilirubin: Neg mg/dL WBC/hpf: NS (Not Seen)  Appearance: Clear Ketones: Neg mg/dL RBC/hpf: 3 - 10/hpf  Specific Gravity: 1.010 Blood: 1+ ery/uL Bacteria: NS (Not Seen)  pH: 7.5 Protein: Neg mg/dL Cystals: NS (Not Seen)  Glucose: Neg mg/dL Urobilinogen: 0.2 mg/dL Casts: NS (Not Seen)    Nitrites: Neg Trichomonas:  Not Present    Leukocyte Esterase: Neg leu/uL Mucous: Not Present      Epithelial Cells: NS (Not Seen)      Yeast: NS (Not Seen)      Sperm: Not Present    ASSESSMENT:      ICD-10 Details  1 GU:   Bladder Cancer Posterior - C67.4 Undiagnosed New Problem - He has recurrent tumor on the left posterior bladder wall that appears to be about 29mm and superficial. I will get him set up for a cystoscopy with bil RTG's, TURBT and instillation of gemcitabine. Risks of bleeding, infection, bladder injury, need for secondary procedures, chemical cystitis, thrombotic events and anesthetic complications reviewed.   2   Microscopic hematuria - R31.21 Chronic, Stable  3   BPH w/LUTS - N40.1 Chronic, Stable - He will stay on tamsulosin 2 daily. His prostate volume is 124ml on [place]. He may be interested in Rezum therapy but I will hold off on that until we have the bladder tumor managed.   4   Incomplete bladder emptying - R39.14 Chronic, Stable     PLAN:           Orders X-Rays: Prostate Ultrasound          Schedule Return Visit/Planned Activity: Next Available Appointment - Schedule Surgery  Procedure: Unspecified Date - Cystoscopy TURBT <2 cm - 50539 Notes: next available.

## 2020-01-21 ENCOUNTER — Ambulatory Visit (HOSPITAL_BASED_OUTPATIENT_CLINIC_OR_DEPARTMENT_OTHER): Payer: Medicare Other | Admitting: Physician Assistant

## 2020-01-21 ENCOUNTER — Encounter (HOSPITAL_BASED_OUTPATIENT_CLINIC_OR_DEPARTMENT_OTHER): Payer: Self-pay | Admitting: Urology

## 2020-01-21 ENCOUNTER — Ambulatory Visit (HOSPITAL_BASED_OUTPATIENT_CLINIC_OR_DEPARTMENT_OTHER)
Admission: RE | Admit: 2020-01-21 | Discharge: 2020-01-21 | Disposition: A | Discharge status: Home / Self Care | Payer: Medicare Other | Attending: Urology | Admitting: Urology

## 2020-01-21 ENCOUNTER — Other Ambulatory Visit: Payer: Self-pay

## 2020-01-21 ENCOUNTER — Encounter (HOSPITAL_BASED_OUTPATIENT_CLINIC_OR_DEPARTMENT_OTHER)
Admission: RE | Disposition: A | Discharge status: Home / Self Care | Payer: Self-pay | Source: Home / Self Care | Attending: Urology

## 2020-01-21 DIAGNOSIS — N320 Bladder-neck obstruction: Secondary | ICD-10-CM | POA: Insufficient documentation

## 2020-01-21 DIAGNOSIS — M797 Fibromyalgia: Secondary | ICD-10-CM | POA: Diagnosis not present

## 2020-01-21 DIAGNOSIS — Z881 Allergy status to other antibiotic agents status: Secondary | ICD-10-CM | POA: Diagnosis not present

## 2020-01-21 DIAGNOSIS — Z888 Allergy status to other drugs, medicaments and biological substances status: Secondary | ICD-10-CM | POA: Insufficient documentation

## 2020-01-21 DIAGNOSIS — N401 Enlarged prostate with lower urinary tract symptoms: Secondary | ICD-10-CM | POA: Insufficient documentation

## 2020-01-21 DIAGNOSIS — C679 Malignant neoplasm of bladder, unspecified: Secondary | ICD-10-CM | POA: Insufficient documentation

## 2020-01-21 DIAGNOSIS — Z882 Allergy status to sulfonamides status: Secondary | ICD-10-CM | POA: Insufficient documentation

## 2020-01-21 DIAGNOSIS — Z87891 Personal history of nicotine dependence: Secondary | ICD-10-CM | POA: Diagnosis not present

## 2020-01-21 DIAGNOSIS — R3129 Other microscopic hematuria: Secondary | ICD-10-CM | POA: Insufficient documentation

## 2020-01-21 DIAGNOSIS — D494 Neoplasm of unspecified behavior of bladder: Secondary | ICD-10-CM | POA: Diagnosis present

## 2020-01-21 DIAGNOSIS — R338 Other retention of urine: Secondary | ICD-10-CM | POA: Diagnosis not present

## 2020-01-21 DIAGNOSIS — Z86718 Personal history of other venous thrombosis and embolism: Secondary | ICD-10-CM | POA: Insufficient documentation

## 2020-01-21 DIAGNOSIS — R351 Nocturia: Secondary | ICD-10-CM | POA: Diagnosis not present

## 2020-01-21 HISTORY — PX: TRANSURETHRAL RESECTION OF BLADDER TUMOR WITH MITOMYCIN-C: SHX6459

## 2020-01-21 HISTORY — DX: Other pulmonary embolism without acute cor pulmonale: I26.99

## 2020-01-21 HISTORY — DX: Personal history of other venous thrombosis and embolism: Z86.718

## 2020-01-21 HISTORY — PX: CYSTOSCOPY W/ RETROGRADES: SHX1426

## 2020-01-21 HISTORY — DX: Hypothyroidism, unspecified: E03.9

## 2020-01-21 HISTORY — DX: Fibromyalgia: M79.7

## 2020-01-21 LAB — POCT I-STAT, CHEM 8
BUN: 24 mg/dL — ABNORMAL HIGH (ref 8–23)
Calcium, Ion: 1.27 mmol/L (ref 1.15–1.40)
Chloride: 105 mmol/L (ref 98–111)
Creatinine, Ser: 1.2 mg/dL (ref 0.61–1.24)
Glucose, Bld: 99 mg/dL (ref 70–99)
HCT: 39 % (ref 39.0–52.0)
Hemoglobin: 13.3 g/dL (ref 13.0–17.0)
Potassium: 3.9 mmol/L (ref 3.5–5.1)
Sodium: 142 mmol/L (ref 135–145)
TCO2: 30 mmol/L (ref 22–32)

## 2020-01-21 SURGERY — TRANSURETHRAL RESECTION OF BLADDER TUMOR WITH MITOMYCIN-C
Anesthesia: General | Site: Bladder

## 2020-01-21 MED ORDER — ONDANSETRON HCL 4 MG/2ML IJ SOLN
INTRAMUSCULAR | Status: AC
  Filled 2020-01-21: qty 2

## 2020-01-21 MED ORDER — ROCURONIUM BROMIDE 10 MG/ML (PF) SYRINGE
PREFILLED_SYRINGE | INTRAVENOUS | Status: DC | PRN
  Administered 2020-01-21: 70 mg via INTRAVENOUS

## 2020-01-21 MED ORDER — PROPOFOL 10 MG/ML IV BOLUS
INTRAVENOUS | Status: DC | PRN
  Administered 2020-01-21: 150 mg via INTRAVENOUS

## 2020-01-21 MED ORDER — SUGAMMADEX SODIUM 200 MG/2ML IV SOLN
INTRAVENOUS | Status: DC | PRN
  Administered 2020-01-21: 200 mg via INTRAVENOUS

## 2020-01-21 MED ORDER — PROPOFOL 10 MG/ML IV BOLUS
INTRAVENOUS | Status: AC
  Filled 2020-01-21: qty 20

## 2020-01-21 MED ORDER — LACTATED RINGERS IV SOLN: INTRAVENOUS | Status: DC

## 2020-01-21 MED ORDER — SODIUM CHLORIDE 0.9 % IR SOLN
Status: DC | PRN
  Administered 2020-01-21: 6000 mL via INTRAVESICAL

## 2020-01-21 MED ORDER — ROCURONIUM BROMIDE 10 MG/ML (PF) SYRINGE
PREFILLED_SYRINGE | INTRAVENOUS | Status: AC
  Filled 2020-01-21: qty 10

## 2020-01-21 MED ORDER — OXYCODONE-ACETAMINOPHEN 5-325 MG PO TABS
ORAL_TABLET | ORAL | Status: AC
  Filled 2020-01-21: qty 1

## 2020-01-21 MED ORDER — CIPROFLOXACIN IN D5W 400 MG/200ML IV SOLN
INTRAVENOUS | Status: AC
  Filled 2020-01-21: qty 200

## 2020-01-21 MED ORDER — CIPROFLOXACIN IN D5W 400 MG/200ML IV SOLN
400.0000 mg | INTRAVENOUS | Status: AC
  Administered 2020-01-21: 400 mg via INTRAVENOUS

## 2020-01-21 MED ORDER — DEXAMETHASONE SODIUM PHOSPHATE 10 MG/ML IJ SOLN
INTRAMUSCULAR | Status: AC
  Filled 2020-01-21: qty 1

## 2020-01-21 MED ORDER — ONDANSETRON HCL 4 MG/2ML IJ SOLN: 4.0000 mg | Freq: Once | INTRAMUSCULAR | Status: DC | PRN

## 2020-01-21 MED ORDER — LIDOCAINE 2% (20 MG/ML) 5 ML SYRINGE
INTRAMUSCULAR | Status: DC | PRN
  Administered 2020-01-21: 80 mg via INTRAVENOUS

## 2020-01-21 MED ORDER — GEMCITABINE CHEMO FOR BLADDER INSTILLATION 2000 MG
2000.0000 mg | Freq: Once | INTRAVENOUS | Status: AC
  Administered 2020-01-21: 2000 mg via INTRAVESICAL
  Filled 2020-01-21: qty 2000

## 2020-01-21 MED ORDER — DEXAMETHASONE SODIUM PHOSPHATE 10 MG/ML IJ SOLN
INTRAMUSCULAR | Status: DC | PRN
  Administered 2020-01-21: 10 mg via INTRAVENOUS

## 2020-01-21 MED ORDER — FENTANYL CITRATE (PF) 100 MCG/2ML IJ SOLN: 25.0000 ug | INTRAMUSCULAR | Status: DC | PRN

## 2020-01-21 MED ORDER — IOHEXOL 300 MG/ML  SOLN
INTRAMUSCULAR | Status: DC | PRN
  Administered 2020-01-21: 13 mL via URETHRAL

## 2020-01-21 MED ORDER — FENTANYL CITRATE (PF) 100 MCG/2ML IJ SOLN
INTRAMUSCULAR | Status: AC
  Filled 2020-01-21: qty 2

## 2020-01-21 MED ORDER — EPHEDRINE SULFATE-NACL 50-0.9 MG/10ML-% IV SOSY
PREFILLED_SYRINGE | INTRAVENOUS | Status: DC | PRN
  Administered 2020-01-21: 10 mg via INTRAVENOUS

## 2020-01-21 MED ORDER — ONDANSETRON HCL 4 MG/2ML IJ SOLN
INTRAMUSCULAR | Status: DC | PRN
  Administered 2020-01-21: 4 mg via INTRAVENOUS

## 2020-01-21 MED ORDER — LIDOCAINE 2% (20 MG/ML) 5 ML SYRINGE
INTRAMUSCULAR | Status: AC
  Filled 2020-01-21: qty 5

## 2020-01-21 MED ORDER — SODIUM CHLORIDE 0.9% FLUSH: 3.0000 mL | Freq: Two times a day (BID) | INTRAVENOUS | Status: DC

## 2020-01-21 MED ORDER — FENTANYL CITRATE (PF) 100 MCG/2ML IJ SOLN
INTRAMUSCULAR | Status: DC | PRN
Start: 2020-01-21 — End: 2020-01-21
  Administered 2020-01-21: 75 ug via INTRAVENOUS
  Administered 2020-01-21: 25 ug via INTRAVENOUS

## 2020-01-21 MED ORDER — EPHEDRINE 5 MG/ML INJ
INTRAVENOUS | Status: AC
  Filled 2020-01-21: qty 10

## 2020-01-21 MED ORDER — OXYCODONE-ACETAMINOPHEN 5-325 MG PO TABS
1.0000 | ORAL_TABLET | Freq: Once | ORAL | Status: AC
  Administered 2020-01-21: 1 via ORAL

## 2020-01-21 SURGICAL SUPPLY — 40 items
BAG DRAIN URO-CYSTO SKYTR STRL (DRAIN) ×4 IMPLANT
BAG DRN RND TRDRP ANRFLXCHMBR (UROLOGICAL SUPPLIES) ×2
BAG DRN UROCATH (DRAIN) ×2
BAG URINE DRAIN 2000ML AR STRL (UROLOGICAL SUPPLIES) ×2 IMPLANT
BAG URINE LEG 500ML (DRAIN) IMPLANT
BASKET STONE 1.7 NGAGE (UROLOGICAL SUPPLIES) IMPLANT
BASKET ZERO TIP NITINOL 2.4FR (BASKET) IMPLANT
BSKT STON RTRVL ZERO TP 2.4FR (BASKET)
CATH FOLEY 2WAY SLVR  5CC 20FR (CATHETERS) ×4
CATH FOLEY 2WAY SLVR  5CC 22FR (CATHETERS)
CATH FOLEY 2WAY SLVR 5CC 20FR (CATHETERS) IMPLANT
CATH FOLEY 2WAY SLVR 5CC 22FR (CATHETERS) IMPLANT
CATH URET 5FR 28IN CONE TIP (BALLOONS)
CATH URET 5FR 28IN OPEN ENDED (CATHETERS) IMPLANT
CATH URET 5FR 70CM CONE TIP (BALLOONS) IMPLANT
CLOTH BEACON ORANGE TIMEOUT ST (SAFETY) ×4 IMPLANT
ELECT REM PT RETURN 9FT ADLT (ELECTROSURGICAL) ×4
ELECTRODE REM PT RTRN 9FT ADLT (ELECTROSURGICAL) ×2 IMPLANT
FIBER LASER FLEXIVA 365 (UROLOGICAL SUPPLIES) IMPLANT
FIBER LASER TRAC TIP (UROLOGICAL SUPPLIES) IMPLANT
GLOVE SURG SS PI 8.0 STRL IVOR (GLOVE) ×4 IMPLANT
GOWN STRL REUS W/TWL XL LVL3 (GOWN DISPOSABLE) ×4 IMPLANT
GUIDEWIRE ANG ZIPWIRE 038X150 (WIRE) IMPLANT
GUIDEWIRE STR DUAL SENSOR (WIRE) ×4 IMPLANT
HOLDER FOLEY CATH W/STRAP (MISCELLANEOUS) ×2 IMPLANT
IV NS IRRIG 3000ML ARTHROMATIC (IV SOLUTION) ×6 IMPLANT
KIT TURNOVER CYSTO (KITS) ×4 IMPLANT
LOOP CUT BIPOLAR 24F LRG (ELECTROSURGICAL) ×2 IMPLANT
MANIFOLD NEPTUNE II (INSTRUMENTS) ×4 IMPLANT
NS IRRIG 1000ML POUR BTL (IV SOLUTION) ×2 IMPLANT
NS IRRIG 500ML POUR BTL (IV SOLUTION) ×4 IMPLANT
PACK CYSTO (CUSTOM PROCEDURE TRAY) ×4 IMPLANT
PLUG CATH AND CAP STER (CATHETERS) ×2 IMPLANT
SYR TOOMEY IRRIG 70ML (MISCELLANEOUS)
SYRINGE TOOMEY IRRIG 70ML (MISCELLANEOUS) IMPLANT
TUBE CONNECTING 12'X1/4 (SUCTIONS) ×1
TUBE CONNECTING 12X1/4 (SUCTIONS) ×3 IMPLANT
TUBING UROLOGY SET (TUBING) ×4 IMPLANT
WATER STERILE IRR 1000ML POUR (IV SOLUTION) ×2 IMPLANT
WATER STERILE IRR 3000ML UROMA (IV SOLUTION) ×2 IMPLANT

## 2020-01-21 NOTE — Anesthesia Preprocedure Evaluation (Addendum)
Anesthesia Evaluation  Patient identified by MRN, date of birth, ID band Patient awake    Reviewed: Allergy & Precautions, NPO status , Patient's Chart, lab work & pertinent test results  Airway Mallampati: II  TM Distance: >3 FB Neck ROM: Full    Dental no notable dental hx.    Pulmonary former smoker, PE   Pulmonary exam normal breath sounds clear to auscultation       Cardiovascular Exercise Tolerance: Good Normal cardiovascular exam Rhythm:Regular Rate:Normal  H/o DVT/PE and IVC filter on eliquis (last dose 01/18/20)  EKG 01/14/20 NSR   Neuro/Psych    GI/Hepatic negative GI ROS, Neg liver ROS,   Endo/Other  Hypothyroidism   Renal/GU negative Renal ROS   Bladder tumor    Musculoskeletal  (+) Fibromyalgia -  Abdominal   Peds  Hematology   Anesthesia Other Findings   Reproductive/Obstetrics                            Anesthesia Physical Anesthesia Plan  ASA: III  Anesthesia Plan: General   Post-op Pain Management:    Induction:   PONV Risk Score and Plan: 2 and Ondansetron and Treatment may vary due to age or medical condition  Airway Management Planned: LMA  Additional Equipment:   Intra-op Plan:   Post-operative Plan: Extubation in OR  Informed Consent: I have reviewed the patients History and Physical, chart, labs and discussed the procedure including the risks, benefits and alternatives for the proposed anesthesia with the patient or authorized representative who has indicated his/her understanding and acceptance.       Plan Discussed with: CRNA, Anesthesiologist and Surgeon  Anesthesia Plan Comments:         Anesthesia Quick Evaluation

## 2020-01-21 NOTE — Discharge Instructions (Addendum)
Transurethral Resection of Bladder Tumor, Care After This sheet gives you information about how to care for yourself after your procedure. Your health care provider may also give you more specific instructions. If you have problems or questions, contact your health care provider. What can I expect after the procedure? After the procedure, it is common to have:  A small amount of blood in your urine for up to 2 weeks.  Soreness or mild pain from your catheter. After your catheter is removed, you may have mild soreness, especially when urinating.  Pain in your lower abdomen. Follow these instructions at home: Medicines   Take over-the-counter and prescription medicines only as told by your health care provider.  If you were prescribed an antibiotic medicine, take it as told by your health care provider. Do not stop taking the antibiotic even if you start to feel better.  Do not drive for 24 hours if you were given a sedative during your procedure.  Ask your health care provider if the medicine prescribed to you: ? Requires you to avoid driving or using heavy machinery. ? Can cause constipation. You may need to take these actions to prevent or treat constipation:  Take over-the-counter or prescription medicines.  Eat foods that are high in fiber, such as beans, whole grains, and fresh fruits and vegetables.  Limit foods that are high in fat and processed sugars, such as fried or sweet foods. Activity  Return to your normal activities as told by your health care provider. Ask your health care provider what activities are safe for you.  Do not lift anything that is heavier than 10 lb (4.5 kg), or the limit that you are told, until your health care provider says that it is safe.  Avoid intense physical activity for as long as told by your health care provider.  Rest as told by your health care provider.  Avoid sitting for a long time without moving. Get up to take short walks every  1-2 hours. This is important to improve blood flow and breathing. Ask for help if you feel weak or unsteady. General instructions   Do not drink alcohol for as long as told by your health care provider. This is especially important if you are taking prescription pain medicines.  Do not take baths, swim, or use a hot tub until your health care provider approves. Ask your health care provider if you may take showers. You may only be allowed to take sponge baths.  If you have a catheter, follow instructions from your health care provider about caring for your catheter and your drainage bag.  Drink enough fluid to keep your urine pale yellow.  Wear compression stockings as told by your health care provider. These stockings help to prevent blood clots and reduce swelling in your legs.  Keep all follow-up visits as told by your health care provider. This is important. ? You will need to be followed closely with regular checks of your bladder and urethra (cystoscopies) to make sure that the cancer does not come back. Contact a health care provider if:  You have pain that gets worse or does not improve with medicine.  You have blood in your urine for more than 2 weeks.  You have cloudy or bad-smelling urine.  You become constipated. Signs of constipation may include having: ? Fewer than three bowel movements in a week. ? Difficulty having a bowel movement. ? Stools that are dry, hard, or larger than normal.  You have  a fever. Get help right away if:  You have: ? Severe pain. ? Bright red blood in your urine. ? Blood clots in your urine. ? A lot of blood in your urine.  Your catheter has been removed and you are not able to urinate.  You have a catheter in place and the catheter is not draining urine. Summary  After your procedure, it is common to have a small amount of blood in your urine, soreness or mild pain from your catheter, and pain in your lower abdomen.  Take  over-the-counter and prescription medicines only as told by your health care provider.  Rest as told by your health care provider. Follow your health care provider's instructions about returning to normal activities. Ask what activities are safe for you.  If you have a catheter, follow instructions from your health care provider about caring for your catheter and your drainage bag.  Get help right away if you cannot urinate, you have severe pain, or you have bright red blood or blood clots in your urine.  You may remove the catheter in the morning.  That is done by cutting off the side arm and letting the fluid drain from the balloon.  The catheter should slide out easily.   If you are not comfortable doing that, you can call the office to come have it removed.  You may resume the Eliquis in 72 hrs if you are not having bleeding.   This information is not intended to replace advice given to you by your health care provider. Make sure you discuss any questions you have with your health care provider. Document Revised: 11/02/2017 Document Reviewed: 11/02/2017 Elsevier Patient Education  [place], Adult An indwelling urinary catheter is a thin tube that is put into your bladder. The tube helps to drain pee (urine) out of your body. The tube goes in through your urethra. Your urethra is where pee comes out of your body. Your pee will come out through the catheter, then it will go into a bag (drainage bag). Take good care of your catheter so it will work well. How to wear your catheter and bag Supplies needed  Sticky tape (adhesive tape) or a leg strap.  Alcohol wipe or soap and water (if you use tape).  A clean towel (if you use tape).  Large overnight bag.  Smaller bag (leg bag). Wearing your catheter Attach your catheter to your leg with tape or a leg strap.  Make sure the catheter is not pulled tight.  If a leg strap gets wet, take it off  and put on a dry strap.  If you use tape to hold the bag on your leg: 1. Use an alcohol wipe or soap and water to wash your skin where the tape made it sticky before. 2. Use a clean towel to pat-dry that skin. 3. Use new tape to make the bag stay on your leg. Wearing your bags You should have been given a large overnight bag.  You may wear the overnight bag in the day or night.  Always have the overnight bag lower than your bladder.  Do not let the bag touch the floor.  Before you go to sleep, put a clean plastic bag in a wastebasket. Then hang the overnight bag inside the wastebasket. You should also have a smaller leg bag that fits under your clothes.  Always wear the leg bag below your knee.  Do not wear your  leg bag at night. How to care for your skin and catheter Supplies needed  A clean washcloth.  Water and mild soap.  A clean towel. Caring for your skin and catheter      Clean the skin around your catheter every day: 1. Wash your hands with soap and water. 2. Wet a clean washcloth in warm water and mild soap. 3. Clean the skin around your urethra.  If you are male:  Gently spread the folds of skin around your vagina (labia).  With the washcloth in your other hand, wipe the inner side of your labia on each side. Wipe from front to back.  If you are male:  Pull back any skin that covers the end of your penis (foreskin).  With the washcloth in your other hand, wipe your penis in small circles. Start wiping at the tip of your penis, then move away from the catheter.  Move the foreskin back in place, if needed. 4. With your free hand, hold the catheter close to where it goes into your body.  Keep holding the catheter during cleaning so it does not get pulled out. 5. With the washcloth in your other hand, clean the catheter.  Only wipe downward on the catheter.  Do not wipe upward toward your body. Doing this may push germs into your urethra and cause  infection. 6. Use a clean towel to pat-dry the catheter and the skin around it. Make sure to wipe off all soap. 7. Wash your hands with soap and water.  Shower every day. Do not take baths.  Do not use cream, ointment, or lotion on the area where the catheter goes into your body, unless your doctor tells you to.  Do not use powders, sprays, or lotions on your genital area.  Check your skin around the catheter every day for signs of infection. Check for: ? Redness, swelling, or pain. ? Fluid or blood. ? Warmth. ? Pus or a bad smell. How to empty the bag Supplies needed  Rubbing alcohol.  Gauze pad or cotton ball.  Tape or a leg strap. Emptying the bag Pour the pee out of your bag when it is ?- full, or at least 2-3 times a day. Do this for your overnight bag and your leg bag. 1. Wash your hands with soap and water. 2. Separate (detach) the bag from your leg. 3. Hold the bag over the toilet or a clean pail. Keep the bag lower than your hips and bladder. This is so the pee (urine) does not go back into the tube. 4. Open the pour spout. It is at the bottom of the bag. 5. Empty the pee into the toilet or pail. Do not let the pour spout touch any surface. 6. Put rubbing alcohol on a gauze pad or cotton ball. 7. Use the gauze pad or cotton ball to clean the pour spout. 8. Close the pour spout. 9. Attach the bag to your leg with tape or a leg strap. 10. Wash your hands with soap and water. Follow instructions for cleaning the drainage bag:  From the product maker.  As told by your doctor. How to change the bag Supplies needed  Alcohol wipes.  A clean bag.  Tape or a leg strap. Changing the bag Replace your bag when it starts to leak, smell bad, or look dirty. 1. Wash your hands with soap and water. 2. Separate the dirty bag from your leg. 3. Pinch the catheter with your fingers  so that pee does not spill out. 4. Separate the catheter tube from the bag tube where these  tubes connect (at the connection valve). Do not let the tubes touch any surface. 5. Clean the end of the catheter tube with an alcohol wipe. Use a different alcohol wipe to clean the end of the bag tube. 6. Connect the catheter tube to the tube of the clean bag. 7. Attach the clean bag to your leg with tape or a leg strap. Do not make the bag tight on your leg. 8. Wash your hands with soap and water. General rules   Never pull on your catheter. Never try to take it out. Doing that can hurt you.  Always wash your hands before and after you touch your catheter or bag. Use a mild, fragrance-free soap. If you do not have soap and water, use hand sanitizer.  Always make sure there are no twists or bends (kinks) in the catheter tube.  Always make sure there are no leaks in the catheter or bag.  Drink enough fluid to keep your pee pale yellow.  Do not take baths, swim, or use a hot tub.  If you are male, wipe from front to back after you poop (have a bowel movement). Contact a doctor if:  Your pee is cloudy.  Your pee smells worse than usual.  Your catheter gets clogged.  Your catheter leaks.  Your bladder feels full. Get help right away if:  You have redness, swelling, or pain where the catheter goes into your body.  You have fluid, blood, pus, or a bad smell coming from the area where the catheter goes into your body.  Your skin feels warm where the catheter goes into your body.  You have a fever.  You have pain in your: ? Belly (abdomen). ? Legs. ? Lower back. ? Bladder.  You see blood in the catheter.  Your pee is pink or red.  You feel sick to your stomach (nauseous).  You throw up (vomit).  You have chills.  Your pee is not draining into the bag.  Your catheter gets pulled out. Summary  An indwelling urinary catheter is a thin tube that is placed into the bladder to help drain pee (urine) out of the body.  The catheter is placed into the part of the  body that drains pee from the bladder (urethra).  Taking good care of your catheter will keep it working properly and help prevent problems.  Always wash your hands before and after touching your catheter or bag.  Never pull on your catheter or try to take it out. This information is not intended to replace advice given to you by your health care provider. Make sure you discuss any questions you have with your health care provider. Document Revised: 07/27/2018 Document Reviewed: 11/18/2016 Elsevier Patient Education  [place] Instructions  Activity: Get plenty of rest for the remainder of the day. A responsible individual must stay with you for 24 hours following the procedure.  For the next 24 hours, DO NOT: -Drive a car -Paediatric nurse -Drink alcoholic beverages -Take any medication unless instructed by your physician -Make any legal decisions or sign important papers.  Meals: Start with liquid foods such as gelatin or soup. Progress to regular foods as tolerated. Avoid greasy, spicy, heavy foods. If nausea and/or vomiting occur, drink only clear liquids until the nausea and/or vomiting subsides. Call your physician if vomiting continues.  Special Instructions/Symptoms: Your throat may feel dry or sore from the anesthesia or the breathing tube placed in your throat during surgery. If this causes discomfort, gargle with warm salt water. The discomfort should disappear within 24 hours.

## 2020-01-21 NOTE — Anesthesia Postprocedure Evaluation (Signed)
Anesthesia Post Note  Patient: [name]  Procedure(s) Performed: TRANSURETHRAL RESECTION OF BLADDER TUMOR WITH GEMCITABINE (N/A Bladder) CYSTOSCOPY WITH BILATERAL RETROGRADE (Bilateral Bladder)     Patient location during evaluation: PACU Anesthesia Type: General Level of consciousness: awake and alert Pain management: pain level controlled Vital Signs Assessment: post-procedure vital signs reviewed and stable Respiratory status: spontaneous breathing, nonlabored ventilation and respiratory function stable Cardiovascular status: blood pressure returned to baseline and stable Postop Assessment: no apparent nausea or vomiting Anesthetic complications: no   No complications documented.  Last Vitals:  Vitals:   01/21/20 1100 01/21/20 1210  BP: 129/76 116/65  Pulse: 66 64  Resp: 12 16  Temp: 36.4 C (!) 34.7 C  SpO2: 99% 99%    Last Pain:  Vitals:   01/21/20 1210  TempSrc: Oral  PainSc: [place]

## 2020-01-21 NOTE — Anesthesia Procedure Notes (Signed)
Procedure Name: Intubation Date/Time: 01/21/2020 8:59 AM Performed by: Rogers Blocker, CRNA Pre-anesthesia Checklist: Patient identified, Emergency Drugs available, Suction available and Patient being monitored Patient Re-evaluated:Patient Re-evaluated prior to induction Oxygen Delivery Method: Circle System Utilized Preoxygenation: Pre-oxygenation with 100% oxygen Induction Type: IV induction Ventilation: Mask ventilation without difficulty Laryngoscope Size: Mac and 4 Grade View: Grade I Tube type: Oral Number of attempts: 1 Airway Equipment and Method: Stylet and Oral airway Placement Confirmation: ETT inserted through vocal cords under direct vision,  positive ETCO2 and breath sounds checked- equal and bilateral Tube secured with: Tape Dental Injury: Teeth and Oropharynx as per pre-operative assessment

## 2020-01-21 NOTE — Interval H&P Note (Signed)
History and Physical Interval Note:  01/21/2020 8:22 AM  Gilbert Clark  has presented today for surgery, with the diagnosis of BLADDER TUMOR.  The various methods of treatment have been discussed with the patient and family. After consideration of risks, benefits and other options for treatment, the patient has consented to  Procedure(s): TRANSURETHRAL RESECTION OF BLADDER TUMOR WITH GEMCITABINE (N/A) CYSTOSCOPY WITH BILATERAL RETROGRADE (Bilateral) as a surgical intervention.  The patient's history has been reviewed, patient examined, no change in status, stable for surgery.  I have reviewed the patient's chart and labs.  Questions were answered to the patient's satisfaction.     Irine Seal

## 2020-01-21 NOTE — Transfer of Care (Signed)
Immediate Anesthesia Transfer of Care Note  Patient: Gilbert Clark  Procedure(s) Performed: TRANSURETHRAL RESECTION OF BLADDER TUMOR WITH GEMCITABINE (N/A Bladder) CYSTOSCOPY WITH BILATERAL RETROGRADE (Bilateral Bladder)  Patient Location: PACU  Anesthesia Type:General  Level of Consciousness: awake, alert , oriented and patient cooperative  Airway & Oxygen Therapy: Patient Spontanous Breathing  Post-op Assessment: Report given to RN, Post -op Vital signs reviewed and stable and Patient moving all extremities X 4  Post vital signs: Reviewed and stable  Last Vitals:  Vitals Value Taken Time  BP 150/76 01/21/20 0942  Temp 36.5 C 01/21/20 0943  Pulse 77 01/21/20 0943  Resp 17 01/21/20 0943  SpO2 97 % 01/21/20 0943  Vitals shown include unvalidated device data.  Last Pain:  Vitals:   01/21/20 0655  TempSrc: Oral         Complications: No complications documented.

## 2020-01-21 NOTE — Op Note (Signed)
Procedure: 1.  Cystoscopy with bilateral retrograde pyelograms and interpretation. 2.  Transurethral resection of left lateral wall 3 cm bladder tumor 3.  Instillation of gemcitabine in PACU.  Preop diagnosis: Left lateral wall bladder tumor.  Postop diagnosis: Same.  Surgeon: Dr. Irine Seal.  Anesthesia: General.  Specimen: Bladder tumor chips.  Drains: 20 French Foley catheter.  EBL: None.  Complications: None.  Indications: The patient is an 84 year old male with a history of bladder cancer was found to have recurrence on surveillance cystoscopy on the left lateral wall and he is to undergo cystoscopy with bilateral retrograde pyelography, transurethral resection of the bladder tumor and instillation of gemcitabine.  Procedure: He was taken operating room given antibiotics.  A general anesthetic was induced and he was intubated and subsequently paralyzed for the procedure.  He was placed in lithotomy position and fitted with PAS hose.  He was draped in usual sterile fashion.  Cystoscopy was performed using a 23 [place] scope and 30 lens.  Examination revealed a normal urethra.  The external sphincter was intact.  The prostatic urethra was approximately 3 cm in length with bilobar hyperplasia with coaptation and some obstruction.  Examination of bladder demonstrated moderate trabeculation.  There was a bladder tumor on the left lateral wall below 3:00 there was approximately 1 cm in size and this was the tumor that had been seen on cystoscopy in the office but on further inspection there were small papillary lesions in an area approximately 3 cm in size around the primary lesion.  No other bladder tumors were noted on the remainder of the bladder wall.  Ureteral orifices were in the normal anatomic position.  Bilateral retrograde pyelography was performed using a 5 French opening catheter and Omnipaque.  The right retrograde pyelogram revealed a normal ureter and intrarenal collecting  system.  The left retrograde pyelogram revealed a normal ureter and intrarenal collecting system.  The urethra was then calibrated to 30 [place] with [employer] sounds and the 26 French continuous-flow resectoscope sheath was inserted using the visual obturator.  The visual obturator was replaced with the [facility] handle with a bipolar loop and 30 lens.  Saline was used the irrigant.  The bladder tumor was then resected into the muscular fibers in all visible papillary lesions were removed.  A rim of mucosa was then fulgurated around the resection defect and the resection defect was generously fulgurated.  The bladder was evacuated free of the specimen which was collected for pathology.  Final inspection revealed minimal mucosal bleeding at the bladder neck but no bleeding from the resection bed and no bladder wall injury.  The scope was removed and a 20 [place] Foley catheter was inserted.  The balloon was filled with 10 mL of sterile fluid.  The catheter was placed to straight drainage.  He was taken down from lithotomy position, his anesthetic was reversed and he was moved recovery in stable condition.  In the recovery room his bladder was instilled with 2000 mg of gemcitabine and 50 mL of diluent.  This was left indwelling for 1 h and the bladder was then drained.  There were no complications.

## 2020-01-22 ENCOUNTER — Encounter (HOSPITAL_BASED_OUTPATIENT_CLINIC_OR_DEPARTMENT_OTHER): Payer: Self-pay | Admitting: Urology

## 2020-01-22 LAB — SURGICAL PATHOLOGY

## 2020-04-21 DIAGNOSIS — K5900 Constipation, unspecified: Secondary | ICD-10-CM | POA: Diagnosis not present

## 2020-04-21 DIAGNOSIS — H9193 Unspecified hearing loss, bilateral: Secondary | ICD-10-CM | POA: Diagnosis not present

## 2020-04-21 DIAGNOSIS — M353 Polymyalgia rheumatica: Secondary | ICD-10-CM | POA: Diagnosis not present

## 2020-04-21 DIAGNOSIS — E782 Mixed hyperlipidemia: Secondary | ICD-10-CM | POA: Diagnosis not present

## 2020-04-21 DIAGNOSIS — E039 Hypothyroidism, unspecified: Secondary | ICD-10-CM | POA: Diagnosis not present

## 2020-04-21 DIAGNOSIS — M6281 Muscle weakness (generalized): Secondary | ICD-10-CM | POA: Diagnosis not present

## 2020-04-21 DIAGNOSIS — Z79899 Other long term (current) drug therapy: Secondary | ICD-10-CM | POA: Diagnosis not present

## 2020-04-21 DIAGNOSIS — R413 Other amnesia: Secondary | ICD-10-CM | POA: Diagnosis not present

## 2020-04-21 DIAGNOSIS — D649 Anemia, unspecified: Secondary | ICD-10-CM | POA: Diagnosis not present

## 2020-04-21 DIAGNOSIS — Z23 Encounter for immunization: Secondary | ICD-10-CM | POA: Diagnosis not present

## 2020-04-24 ENCOUNTER — Other Ambulatory Visit: Payer: Self-pay | Admitting: Family Medicine

## 2020-04-24 DIAGNOSIS — R413 Other amnesia: Secondary | ICD-10-CM

## 2020-04-29 DIAGNOSIS — M25512 Pain in left shoulder: Secondary | ICD-10-CM | POA: Diagnosis not present

## 2020-04-29 DIAGNOSIS — M797 Fibromyalgia: Secondary | ICD-10-CM | POA: Diagnosis not present

## 2020-04-29 DIAGNOSIS — M25561 Pain in right knee: Secondary | ICD-10-CM | POA: Diagnosis not present

## 2020-04-29 DIAGNOSIS — M25562 Pain in left knee: Secondary | ICD-10-CM | POA: Diagnosis not present

## 2020-04-29 DIAGNOSIS — M25511 Pain in right shoulder: Secondary | ICD-10-CM | POA: Diagnosis not present

## 2020-05-06 DIAGNOSIS — M25512 Pain in left shoulder: Secondary | ICD-10-CM | POA: Diagnosis not present

## 2020-05-06 DIAGNOSIS — M25511 Pain in right shoulder: Secondary | ICD-10-CM | POA: Diagnosis not present

## 2020-05-06 DIAGNOSIS — G8929 Other chronic pain: Secondary | ICD-10-CM | POA: Diagnosis not present

## 2020-05-06 DIAGNOSIS — M797 Fibromyalgia: Secondary | ICD-10-CM | POA: Diagnosis not present

## 2020-05-06 DIAGNOSIS — E039 Hypothyroidism, unspecified: Secondary | ICD-10-CM | POA: Diagnosis not present

## 2020-05-06 DIAGNOSIS — M25562 Pain in left knee: Secondary | ICD-10-CM | POA: Diagnosis not present

## 2020-05-06 DIAGNOSIS — M25561 Pain in right knee: Secondary | ICD-10-CM | POA: Diagnosis not present

## 2020-05-06 DIAGNOSIS — E782 Mixed hyperlipidemia: Secondary | ICD-10-CM | POA: Diagnosis not present

## 2020-05-07 DIAGNOSIS — Z961 Presence of intraocular lens: Secondary | ICD-10-CM | POA: Diagnosis not present

## 2020-05-11 DIAGNOSIS — M25562 Pain in left knee: Secondary | ICD-10-CM | POA: Diagnosis not present

## 2020-05-11 DIAGNOSIS — M25512 Pain in left shoulder: Secondary | ICD-10-CM | POA: Diagnosis not present

## 2020-05-11 DIAGNOSIS — M25511 Pain in right shoulder: Secondary | ICD-10-CM | POA: Diagnosis not present

## 2020-05-11 DIAGNOSIS — M25561 Pain in right knee: Secondary | ICD-10-CM | POA: Diagnosis not present

## 2020-05-11 DIAGNOSIS — M797 Fibromyalgia: Secondary | ICD-10-CM | POA: Diagnosis not present

## 2020-05-12 ENCOUNTER — Ambulatory Visit
Admission: RE | Admit: 2020-05-12 | Discharge: 2020-05-12 | Disposition: A | Discharge status: Home / Self Care | Payer: Medicare Other | Source: Ambulatory Visit | Attending: Family Medicine | Admitting: Family Medicine

## 2020-05-12 ENCOUNTER — Other Ambulatory Visit: Payer: Self-pay

## 2020-05-12 DIAGNOSIS — J341 Cyst and mucocele of nose and nasal sinus: Secondary | ICD-10-CM | POA: Diagnosis not present

## 2020-05-12 DIAGNOSIS — R413 Other amnesia: Secondary | ICD-10-CM

## 2020-05-12 DIAGNOSIS — J3489 Other specified disorders of nose and nasal sinuses: Secondary | ICD-10-CM | POA: Diagnosis not present

## 2020-05-12 DIAGNOSIS — I6782 Cerebral ischemia: Secondary | ICD-10-CM | POA: Diagnosis not present

## 2020-05-13 DIAGNOSIS — M25562 Pain in left knee: Secondary | ICD-10-CM | POA: Diagnosis not present

## 2020-05-13 DIAGNOSIS — M25561 Pain in right knee: Secondary | ICD-10-CM | POA: Diagnosis not present

## 2020-05-13 DIAGNOSIS — M25512 Pain in left shoulder: Secondary | ICD-10-CM | POA: Diagnosis not present

## 2020-05-13 DIAGNOSIS — M25511 Pain in right shoulder: Secondary | ICD-10-CM | POA: Diagnosis not present

## 2020-05-14 DIAGNOSIS — M353 Polymyalgia rheumatica: Secondary | ICD-10-CM | POA: Diagnosis not present

## 2020-05-14 DIAGNOSIS — Z7952 Long term (current) use of systemic steroids: Secondary | ICD-10-CM | POA: Diagnosis not present

## 2020-05-18 DIAGNOSIS — M25561 Pain in right knee: Secondary | ICD-10-CM | POA: Diagnosis not present

## 2020-05-18 DIAGNOSIS — M25562 Pain in left knee: Secondary | ICD-10-CM | POA: Diagnosis not present

## 2020-05-18 DIAGNOSIS — M25512 Pain in left shoulder: Secondary | ICD-10-CM | POA: Diagnosis not present

## 2020-05-18 DIAGNOSIS — M25511 Pain in right shoulder: Secondary | ICD-10-CM | POA: Diagnosis not present

## 2020-05-20 ENCOUNTER — Ambulatory Visit: Payer: Medicare Other | Attending: Family Medicine | Admitting: Speech Pathology

## 2020-05-20 ENCOUNTER — Other Ambulatory Visit: Payer: Self-pay

## 2020-05-20 DIAGNOSIS — R4701 Aphasia: Secondary | ICD-10-CM | POA: Insufficient documentation

## 2020-05-20 DIAGNOSIS — R41841 Cognitive communication deficit: Secondary | ICD-10-CM | POA: Diagnosis present

## 2020-05-20 NOTE — Therapy (Signed)
[facility] [address] [place], [place], [address] Phone: [phone]   Fax:  [phone]  Speech Language Pathology Evaluation  Patient Details  Name: Gilbert Clark MRN: [id number] Date of Birth: 1935-12-15 Referring Provider (SLP): Dr. Cari Clark   Encounter Date: 05/20/2020   End of Session - 05/20/20 1609    Visit Number 1    Number of Visits 17    Date for SLP Re-Evaluation 07/15/20    SLP Start Time 33    SLP Stop Time  1103    SLP Time Calculation (min) 58 min    Activity Tolerance Patient tolerated treatment well           Past Medical History:  Diagnosis Date  . Fibromyalgia   . Hx of blood clots   . Hypothyroidism   . Pulmonary embolism (HCC)    hx of     Past Surgical History:  Procedure Laterality Date  . bilateral cataract surgery     . CYSTOSCOPY W/ RETROGRADES Bilateral 01/21/2020   Procedure: CYSTOSCOPY WITH BILATERAL RETROGRADE;  Surgeon: [facility] Seal, MD;  Location: [facility];  Service: Urology;  Laterality: Bilateral;  . IVC FILTER INSERTION      2000  . TRANSURETHRAL RESECTION OF BLADDER TUMOR WITH GYRUS (TURBT-GYRUS)     multiple times   . TRANSURETHRAL RESECTION OF BLADDER TUMOR WITH MITOMYCIN-C N/A 01/21/2020   Procedure: TRANSURETHRAL RESECTION OF BLADDER TUMOR WITH GEMCITABINE;  Surgeon: [facility] Seal, MD;  Location: [facility];  Service: Urology;  Laterality: N/A;    There were no vitals filed for this visit.       SLP Evaluation [facility] - 05/20/20 1018      SLP Visit Information   SLP Received On 05/20/20    Referring Provider (SLP) Dr. Suszanne Clark    Onset Date 1-2 years ago    Medical Diagnosis Memory loss      Subjective   Patient/Family Stated Goal to remember things      General Information   HPI Gilbert Clark reports s/s of word finding and some mild memory issues for past 1-2 years. Gilbert Clark has a referral to  neurology in 2 weeks. Gilbert Clark is referred by his PCP    Mobility Status walks independently      Balance Screen   Has the patient fallen in the past 6 months No    Has the patient had a decrease in activity level because of a fear of falling?  No    Is the patient reluctant to leave their home because of a fear of falling?  No      Prior Functional Status   Cognitive/Linguistic Baseline Within functional limits    Type of Home House     Lives With Alone    Available Support Family    Vocation Retired      Associate Professor   Overall Cognitive Status Impaired/Different from baseline    Area of Impairment Memory    Memory Impaired    Memory Impairment Storage deficit;Decreased recall of new information    Awareness Appears intact      Auditory Comprehension   Overall Auditory Comprehension Appears within functional limits for tasks assessed      Reading Comprehension   Reading Status Not tested      Verbal Expression   Overall Verbal Expression Impaired    Initiation No impairment    Level of Generative/Spontaneous Verbalization Conversation    Naming Impairment  Responsive Not tested    Confrontation 75-100% accurate    Convergent Not tested    Divergent 50-74% accurate    Verbal Errors Phonemic paraphasias;Inconsistent;Not aware of errors    Effective Techniques Semantic cues;Sentence completion;Phonemic cues;Written cues      Written Expression   Dominant Hand Right    Written Expression Not tested      Oral Motor/Sensory Function   Overall Oral Motor/Sensory Function Appears within functional limits for tasks assessed      Motor Speech   Overall Motor Speech Appears within functional limits for tasks assessed      Standardized Assessments   Standardized Assessments  Cognitive Linguistic Quick Test      Cognitive Linguistic Quick Test (Ages 18-69)   Attention WNL    Memory Mild    Executive Function WNL    Language Moderate    Visuospatial Skills WNL    Severity  Rating Total 17    Composite Severity Rating 13.8                           SLP Education - 05/20/20 1609    Education Details environmental compensations for communication    Person(s) Educated Patient    Methods Explanation;Verbal cues;Handout    Comprehension Verbal cues required;Need further instruction            SLP Short Term Goals - 05/20/20 2028      SLP SHORT TERM GOAL #1   Title Pt will utilize compensations for aphasia 4/5 opportunities in  structured tasks with occasional min A over 2 sessions    Time 4    Period Weeks    Status New      SLP SHORT TERM GOAL #2   Title Pt will use written cues for phone conversations, food orders, doctor's office with occasional min A over 2 sessions    Time 4    Period Weeks    Status New      SLP SHORT TERM GOAL #3   Title Pt will carryover 3 environmental compensations for aphasia with occasional min A.    Time 4    Period Weeks    Status New      SLP SHORT TERM GOAL #4   Title Pt will carryover compensatory strategies to keep track of items and report loosing items 3x or less over 5 days    Time 4    Period Weeks    Status New            SLP Long Term Goals - 05/20/20 2033      SLP LONG TERM GOAL #1   Title Pt will carryover compensations for aphasia during 15 minute moderately complex conversation with occasional min A    Time 8    Period Weeks    Status New      SLP LONG TERM GOAL #2   Title Pt will carryover compensations for memory when cooking, Gilbert Clark paying, managing medicaations with rare min A over 3 sessions    Time 8    Period Weeks    Status New      SLP LONG TERM GOAL #3   Title Pt will carryover 2 compensatory strategies to process and recall important/pertinent information from phone and in person conversations with occasional min A over 2 sessions    Time 8    Period Weeks    Status New  Plan - 05/20/20 1610    Clinical Impression Statement [name] "Gilbert Clark"  Gilbert Clark is referred for outpt ST due to difficulty with word finding and communication and mild memory issues. Gilbert Clark is referred by PCP and arrives alone. Gilbert Clark reports that Gilbert Clark "looses the word" when talking and Gilbert Clark "looses all of the words if I'm nervous." Gilbert Clark states this occurs thorughout the day resulting in frustration. Gilbert Clark also endorses some mild memory problems which Gilbert Clark is compensating for sucessfully, including med management and Gilbert Clark paying. Gilbert Clark makes sure to double check the stove and that his doors are locked at night. In conversation, Gilbert Clark has phonemic paraphasias (gum bubble for bubblegum; nammer for hammer) and has halting due to word finding. The CLQT revealed moderate language impairment and mild memory impairment. Attention, executive functions, visual spatial skills and clock drawing were all WNL for his age. On the story retell, Gilbert Clark verbalized 3/18 story elements and recalled 2/6 details with yes/no questions. Gilbert Clark endorses that following and recalling conversations is difficult and Gilbert Clark has to "write it down right away" to recall pertinent information from a conversation or phone call. Generative naming was significantly impaired, Gilbert Clark named 10 animals in 1 minute and 3 "M" words in 1 minute. 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 His speech and language affect his ability to participate in these activities. I recommend skilled ST to maximize communication and cognition for safety, independence and QOL.    Speech Therapy Frequency 2x / week    Duration 8 weeks   17 visits   Treatment/Interventions Environmental controls;Language facilitation;SLP instruction and feedback;Compensatory strategies;Functional tasks;Cognitive reorganization;Compensatory techniques;Patient/family education;Multimodal communcation approach;Internal/external aids;Cueing hierarchy    Potential to Achieve Goals Good           Patient will benefit from skilled therapeutic intervention in order  to improve the following deficits and impairments:   Aphasia  Cognitive communication deficit    Problem List There are no problems to display for this patient.   Lovvorn, Annye Rusk MS, [place] 05/20/2020, 8:38 PM  [facility] [address] [place], [place], [address] Phone: [phone]   Fax:  [phone]  Name: NIGUEL HARGAN MRN: [id number] Date of Birth: 1935-08-11

## 2020-05-20 NOTE — Patient Instructions (Addendum)
   Easy crosswords  When on the phone or in person:  Get the persons attention before you speak  Use eye contact and face the person you are speaking to  Be in close proximity to the person you are speaking to  Turn down any noise in the environment such as the TV, walk away from loud appliances, air conditioners, fans, dish washers etc  Repeat back what you have heard  When talking on the phone don't multitask - just focus on the phone converation  Family is welcome and encouraged to attend ST sessions

## 2020-05-21 DIAGNOSIS — M25511 Pain in right shoulder: Secondary | ICD-10-CM | POA: Diagnosis not present

## 2020-05-21 DIAGNOSIS — M25512 Pain in left shoulder: Secondary | ICD-10-CM | POA: Diagnosis not present

## 2020-05-21 DIAGNOSIS — M25562 Pain in left knee: Secondary | ICD-10-CM | POA: Diagnosis not present

## 2020-05-21 DIAGNOSIS — M25561 Pain in right knee: Secondary | ICD-10-CM | POA: Diagnosis not present

## 2020-05-25 DIAGNOSIS — M25561 Pain in right knee: Secondary | ICD-10-CM | POA: Diagnosis not present

## 2020-05-25 DIAGNOSIS — M25562 Pain in left knee: Secondary | ICD-10-CM | POA: Diagnosis not present

## 2020-05-25 DIAGNOSIS — M25511 Pain in right shoulder: Secondary | ICD-10-CM | POA: Diagnosis not present

## 2020-05-25 DIAGNOSIS — M25512 Pain in left shoulder: Secondary | ICD-10-CM | POA: Diagnosis not present

## 2020-05-26 ENCOUNTER — Encounter: Payer: Self-pay | Admitting: *Deleted

## 2020-05-27 ENCOUNTER — Encounter: Payer: Self-pay | Admitting: Diagnostic Neuroimaging

## 2020-05-27 ENCOUNTER — Ambulatory Visit: Payer: Medicare Other | Admitting: Diagnostic Neuroimaging

## 2020-05-27 VITALS — BP 127/74 | HR 66 | Ht 71.0 in | Wt 208.0 lb

## 2020-05-27 DIAGNOSIS — R6889 Other general symptoms and signs: Secondary | ICD-10-CM | POA: Diagnosis not present

## 2020-05-27 DIAGNOSIS — R4701 Aphasia: Secondary | ICD-10-CM

## 2020-05-27 DIAGNOSIS — D649 Anemia, unspecified: Secondary | ICD-10-CM | POA: Diagnosis not present

## 2020-05-27 DIAGNOSIS — E039 Hypothyroidism, unspecified: Secondary | ICD-10-CM | POA: Diagnosis not present

## 2020-05-27 NOTE — Patient Instructions (Signed)
MILD EXPRESSIVE LANGUAGE DIFFICULTY (possible primary progressive aphasia; history of possible dyslexia as child; ongoing issues with polymyalgia rheumatica, interrupted sleep)  - check B12 level  - follow up hearing testing  - check speech therapy  - compensation techniques and brain health activities reviewed

## 2020-05-27 NOTE — Progress Notes (Signed)
[facility]  PATIENT: Gilbert Clark DOB: 1935/10/25  REFERRING CLINICIAN: Cari Caraway, MD HISTORY FROM: patient and daughter  REASON FOR VISIT: new consult    HISTORICAL  CHIEF COMPLAINT:  Chief Complaint  Patient presents with  . Memory Loss    Rm 7 New Pt dgtrButch Penny  MMSE 27    HISTORY OF PRESENT ILLNESS:   85 year old male here for evaluation of language difficulty.  Symptoms started in 2020 with mild word finding difficulties.  Symptoms have slightly progressed over time.  Symptoms are worse when he is under stress.  He has having difficulty recalling various types of words, sometimes substitutes and incorrect word.  No major problems with short-term memory, navigation, judgment or insight.  He lives alone.  He takes care of all of his ADLs.  He does have some hearing loss and wears hearing aids.  He also has history of possible dyslexia as a child with difficulty in reading comprehension and writing.   REVIEW OF SYSTEMS: Full 14 system review of systems performed and negative with exception of: As per HPI.  ALLERGIES: Allergies  Allergen Reactions  . Augmentin [Amoxicillin-Pot Clavulanate]     On list due to father was allergic   . Epinephrine     On list because father was allergic   . Sulfa Antibiotics     Unknown  On list because father was allergic     HOME MEDICATIONS: Outpatient Medications Prior to Visit  Medication Sig Dispense Refill  . apixaban (ELIQUIS) 2.5 MG TABS tablet Take by mouth 2 (two) times daily.    . Ascorbic Acid (VITAMIN C) 100 MG tablet Take 100 mg by mouth daily. 500 mg 2 times daily    . lactose free nutrition (BOOST) LIQD Take 237 mLs by mouth daily.    Marland Kitchen levothyroxine (SYNTHROID) 125 MCG tablet Take 125 mcg by mouth daily before breakfast.    . Omega-3 Fatty Acids (FISH OIL) 1000 MG CPDR Take by mouth. 2- two times daily    . OVER THE COUNTER MEDICATION Vitamin d 3 3000 mcg daily    . OVER THE COUNTER  MEDICATION Prenatal vitamin 200 mg daily    . OVER THE COUNTER MEDICATION Vegan multivitamin - one daily    . polyethylene glycol powder (GLYCOLAX/MIRALAX) 17 GM/SCOOP powder 1/2 capfull    . predniSONE (DELTASONE) 1 MG tablet Take 4 mg by mouth daily with breakfast.    . tamsulosin (FLOMAX) 0.4 MG CAPS capsule Take 0.4 mg by mouth 2 (two) times daily.    . TURMERIC PO Take by mouth daily.     No facility-administered medications prior to visit.    PAST MEDICAL HISTORY: Past Medical History:  Diagnosis Date  . Anemia   . Bladder cancer ([place])   . BPH (benign prostatic hyperplasia)   . Fibromyalgia   . GERD (gastroesophageal reflux disease)   . High risk medication use   . Hx of blood clots   . Hyperlipidemia   . Hypothyroidism   . Memory loss   . Polymyalgia rheumatica ([place])   . Pulmonary embolism ([place])    hx of     PAST SURGICAL HISTORY: Past Surgical History:  Procedure Laterality Date  . ACHILLES TENDON REPAIR  2004  . bilateral cataract surgery   2020  . CYSTOSCOPY W/ RETROGRADES Bilateral 01/21/2020   Procedure: CYSTOSCOPY WITH BILATERAL RETROGRADE;  Surgeon: Irine Seal, MD;  [facility];  Service: Urology;  Laterality: Bilateral;  .  IVC FILTER INSERTION      2000  . TRANSURETHRAL RESECTION OF BLADDER TUMOR WITH GYRUS (TURBT-GYRUS)     multiple times   . TRANSURETHRAL RESECTION OF BLADDER TUMOR WITH MITOMYCIN-C N/A 01/21/2020   Procedure: TRANSURETHRAL RESECTION OF BLADDER TUMOR WITH GEMCITABINE;  Surgeon: Irine Seal, MD;  Location: [facility];  Service: Urology;  Laterality: N/A;    FAMILY HISTORY: Family History  Problem Relation Age of Onset  . Appendicitis Father        age 38  . Breast cancer Sister     SOCIAL HISTORY: Social History   Socioeconomic History  . Marital status: Widowed    Spouse name: Not on file  . Number of children: 2  . Years of education: 51  . Highest education level: Some college, no  degree  Occupational History    Comment: retired from [employer], [place] [employer]  . Smoking status: Former Smoker    Quit date: 04/18/1962    Years since quitting: 58.1  . Smokeless tobacco: Never Used  Vaping Use  . Vaping Use: Never used  Substance and Sexual Activity  . Alcohol use: Not Currently    Comment: 05/27/20 1 beer weekly  . Drug use: Never  . Sexual activity: Not on file  Other Topics Concern  . Not on file  Social History Narrative   05/27/20 lives alone   Social Determinants of Health   Financial Resource Strain: Not on file  Food Insecurity: Not on file  Transportation Needs: Not on file  Physical Activity: Not on file  Stress: Not on file  Social Connections: Not on file  Intimate Partner Violence: Not on file     PHYSICAL EXAM  GENERAL EXAM/CONSTITUTIONAL: Vitals:  Vitals:   05/27/20 0809  BP: 127/74  Pulse: 66  Weight: 208 lb (94.3 kg)  Height: 5\' 11"  (1.803 m)   Body mass index is 29.01 kg/m. Wt Readings from Last 3 Encounters:  05/27/20 208 lb (94.3 kg)  01/21/20 199 lb 9.6 oz (90.5 kg)    Patient is in no distress; well developed, nourished and groomed; neck is supple  CARDIOVASCULAR:  Examination of carotid arteries is normal; no carotid bruits  Regular rate and rhythm, no murmurs  Examination of peripheral vascular system by observation and palpation is normal  EYES:  Ophthalmoscopic exam of optic discs and posterior segments is normal; no papilledema or hemorrhages No exam data present  MUSCULOSKELETAL:  Gait, strength, tone, movements noted in Neurologic exam below  NEUROLOGIC: MENTAL STATUS:  MMSE - [place] Exam 05/27/2020  Orientation to time 5  Orientation to Place 4  Registration 3  Attention/ Calculation 4  Recall 2  Language- name 2 objects 2  Language- repeat 1  Language- follow 3 step command 3  Language- read & follow direction 1  Write a sentence 1  Copy design 1  Total score 27    awake, alert,  oriented to person, place and time  recent and remote memory intact  normal attention and concentration  language --> INTERMITTENT PARAPHASIC ERRORS, comprehension intact, naming intact  fund of knowledge appropriate  CRANIAL NERVE:   2nd - no papilledema on fundoscopic exam  2nd, 3rd, 4th, 6th - pupils equal and reactive to light, visual fields full to confrontation, extraocular muscles intact, no nystagmus  5th - facial sensation symmetric  7th - facial strength symmetric  8th - hearing DECR  9th - palate elevates symmetrically, uvula midline  11th -  shoulder shrug symmetric  12th - tongue protrusion midline  MOTOR:   normal bulk and tone, full strength in the BUE, BLE  SENSORY:   normal and symmetric to light touch, temperature, vibration  COORDINATION:   finger-nose-finger, fine finger movements normal  REFLEXES:   deep tendon reflexes TRACE and symmetric  GAIT/STATION:   narrow based gait     DIAGNOSTIC DATA (LABS, IMAGING, TESTING) - I reviewed patient records, labs, notes, testing and imaging myself where available.  Lab Results  Component Value Date   HGB 13.3 01/21/2020   HCT 39.0 01/21/2020      Component Value Date/Time   NA 142 01/21/2020 0718   K 3.9 01/21/2020 0718   CL 105 01/21/2020 0718   GLUCOSE 99 01/21/2020 0718   BUN 24 (H) 01/21/2020 0718   CREATININE 1.20 01/21/2020 0718   No results found for: CHOL, HDL, LDLCALC, LDLDIRECT, TRIG, CHOLHDL No results found for: HGBA1C No results found for: VITAMINB12 No results found for: TSH   05/12/20 MRI brain [I reviewed images myself and agree with interpretation. -[facility]]   No evidence of acute intracranial abnormality.  Tiny chronic right posterior frontal lobe cortical infarct.  Moderate cerebral atrophy without appreciable lobar predominance.  Mild cerebral white matter chronic small vessel ischemic disease.  Mild ethmoid sinus mucosal thickening.  Small left  maxillary sinus mucous retention cyst.    ASSESSMENT AND PLAN  85 y.o. year old male here with gradual onset intermittent expressive aphasia with paraphasic errors since 2020.  History of dyslexia, polymyalgia rheumatica, interrupted sleep which may be factors.  Also could represent onset of neurodegenerative condition such as primary progressive aphasia.  Dx:  1. Expressive aphasia      PLAN:  MILD EXPRESSIVE LANGUAGE DIFFICULTY (possible primary progressive aphasia; history of possible dyslexia as child; ongoing issues with polymyalgia rheumatica, interrupted sleep) - check B12 level - follow up hearing testing - check speech therapy - compensation techniques and brain health activities reviewed  Orders Placed This Encounter  Procedures  . Vitamin B12   Return for pending if symptoms worsen or fail to improve. monitor and follow up as needed    Penni Bombard, MD 11/19/4371, 5:78 AM Certified in Neurology, Neurophysiology and Neuroimaging  [facility] [address], [place], [place] [address] [phone]

## 2020-05-28 ENCOUNTER — Encounter: Payer: Self-pay | Admitting: *Deleted

## 2020-05-28 DIAGNOSIS — M25512 Pain in left shoulder: Secondary | ICD-10-CM | POA: Diagnosis not present

## 2020-05-28 DIAGNOSIS — M25562 Pain in left knee: Secondary | ICD-10-CM | POA: Diagnosis not present

## 2020-05-28 DIAGNOSIS — M25561 Pain in right knee: Secondary | ICD-10-CM | POA: Diagnosis not present

## 2020-05-28 DIAGNOSIS — M25511 Pain in right shoulder: Secondary | ICD-10-CM | POA: Diagnosis not present

## 2020-05-28 LAB — VITAMIN B12: Vitamin B-12: 837 pg/mL (ref 232–1245)

## 2020-06-01 DIAGNOSIS — M25512 Pain in left shoulder: Secondary | ICD-10-CM | POA: Diagnosis not present

## 2020-06-01 DIAGNOSIS — M25562 Pain in left knee: Secondary | ICD-10-CM | POA: Diagnosis not present

## 2020-06-01 DIAGNOSIS — M25511 Pain in right shoulder: Secondary | ICD-10-CM | POA: Diagnosis not present

## 2020-06-01 DIAGNOSIS — M25561 Pain in right knee: Secondary | ICD-10-CM | POA: Diagnosis not present

## 2020-06-04 DIAGNOSIS — M25562 Pain in left knee: Secondary | ICD-10-CM | POA: Diagnosis not present

## 2020-06-04 DIAGNOSIS — M797 Fibromyalgia: Secondary | ICD-10-CM | POA: Diagnosis not present

## 2020-06-04 DIAGNOSIS — M25511 Pain in right shoulder: Secondary | ICD-10-CM | POA: Diagnosis not present

## 2020-06-04 DIAGNOSIS — M25561 Pain in right knee: Secondary | ICD-10-CM | POA: Diagnosis not present

## 2020-06-04 DIAGNOSIS — M25512 Pain in left shoulder: Secondary | ICD-10-CM | POA: Diagnosis not present

## 2020-06-08 DIAGNOSIS — R3121 Asymptomatic microscopic hematuria: Secondary | ICD-10-CM | POA: Diagnosis not present

## 2020-06-08 DIAGNOSIS — Z8551 Personal history of malignant neoplasm of bladder: Secondary | ICD-10-CM | POA: Diagnosis not present

## 2020-06-08 DIAGNOSIS — R3914 Feeling of incomplete bladder emptying: Secondary | ICD-10-CM | POA: Diagnosis not present

## 2020-06-11 DIAGNOSIS — M25511 Pain in right shoulder: Secondary | ICD-10-CM | POA: Diagnosis not present

## 2020-06-11 DIAGNOSIS — M25512 Pain in left shoulder: Secondary | ICD-10-CM | POA: Diagnosis not present

## 2020-06-11 DIAGNOSIS — M797 Fibromyalgia: Secondary | ICD-10-CM | POA: Diagnosis not present

## 2020-06-11 DIAGNOSIS — M25562 Pain in left knee: Secondary | ICD-10-CM | POA: Diagnosis not present

## 2020-06-11 DIAGNOSIS — M25561 Pain in right knee: Secondary | ICD-10-CM | POA: Diagnosis not present

## 2020-06-15 DIAGNOSIS — M25512 Pain in left shoulder: Secondary | ICD-10-CM | POA: Diagnosis not present

## 2020-06-15 DIAGNOSIS — M25562 Pain in left knee: Secondary | ICD-10-CM | POA: Diagnosis not present

## 2020-06-15 DIAGNOSIS — M25511 Pain in right shoulder: Secondary | ICD-10-CM | POA: Diagnosis not present

## 2020-06-15 DIAGNOSIS — M25561 Pain in right knee: Secondary | ICD-10-CM | POA: Diagnosis not present

## 2020-06-17 ENCOUNTER — Other Ambulatory Visit: Payer: Self-pay

## 2020-06-17 ENCOUNTER — Ambulatory Visit: Payer: Medicare Other | Attending: Family Medicine | Admitting: Speech Pathology

## 2020-06-17 ENCOUNTER — Encounter: Payer: Self-pay | Admitting: Speech Pathology

## 2020-06-17 DIAGNOSIS — R4701 Aphasia: Secondary | ICD-10-CM | POA: Insufficient documentation

## 2020-06-17 DIAGNOSIS — R41841 Cognitive communication deficit: Secondary | ICD-10-CM | POA: Diagnosis present

## 2020-06-17 NOTE — Patient Instructions (Signed)
  You have "Aphasia" which affects all aspects of language (understanding, reading, talking and writing)   We may want to make a cheat sheet of the words you use most often:   Make a list of all of the people you talk to: (first and last names)  Every day  Once a Week  Once a month  Places you go to every week  Every month  Every few months  List of medical issues  When you can't think of a word, go ahead and describe it, use gestures, talk around it,   What does it look like, where do you keep it, what do you do with it? Then others can guess or help you find the word

## 2020-06-17 NOTE — Therapy (Signed)
[facility] [address] [place], [place], [address] Phone: [phone]   Fax:  [phone]  Speech Language Pathology Treatment  Patient Details  Name: Gilbert Clark MRN: [id number] Date of Birth: 1935-12-12 Referring Provider (SLP): Dr. Suszanne Finch   Encounter Date: 06/17/2020   End of Session - 06/17/20 1422    Visit Number 2    Number of Visits 17    Date for SLP Re-Evaluation 07/15/20    SLP Start Time 4650    SLP Stop Time  1401    SLP Time Calculation (min) 44 min           Past Medical History:  Diagnosis Date  . Anemia   . Bladder cancer ([place])   . BPH (benign prostatic hyperplasia)   . Fibromyalgia   . GERD (gastroesophageal reflux disease)   . High risk medication use   . Hx of blood clots   . Hyperlipidemia   . Hypothyroidism   . Memory loss   . Polymyalgia rheumatica ([place])   . Pulmonary embolism (H[facility])    hx of     Past Surgical History:  Procedure Laterality Date  . ACHILLES TENDON REPAIR  2004  . bilateral cataract surgery   2020  . CYSTOSCOPY W/ RETROGRADES Bilateral 01/21/2020   Procedure: CYSTOSCOPY WITH BILATERAL RETROGRADE;  Surgeon: Irine Seal, MD;  Location: [facility];  Service: Urology;  Laterality: Bilateral;  . IVC FILTER INSERTION      2000  . TRANSURETHRAL RESECTION OF BLADDER TUMOR WITH GYRUS (TURBT-GYRUS)     multiple times   . TRANSURETHRAL RESECTION OF BLADDER TUMOR WITH MITOMYCIN-C N/A 01/21/2020   Procedure: TRANSURETHRAL RESECTION OF BLADDER TUMOR WITH GEMCITABINE;  Surgeon: Irine Seal, MD;  Location: [facility];  Service: Urology;  Laterality: N/A;    There were no vitals filed for this visit.   Subjective Assessment - 06/17/20 1324    Subjective "I can't think of the words, and then they don't come"    Currently in Pain? No/denies                 ADULT SLP TREATMENT - 06/17/20 1326      General Information    Behavior/Cognition Alert;Cooperative;Pleasant mood      Treatment Provided   Treatment provided Cognitive-Linquistic      Cognitive-Linquistic Treatment   Treatment focused on Aphasia;Cognition;Patient/family/caregiver education    Skilled Treatment Pt generated family names with mod I, except his daughter Gilbert Clark last name. Initiated training for compensations for aphasia in divergent naming task (furniture, as pt refurbishes furniture for [name]) When anomia present, encouraged Gilbert Clark to describe item. He was successful with occasional min questioning cues. He spontaneously used gesture of typing when describing "desk" I encouraged use of gestures. He spontaneously used gestures 2 other opportunities. Educated pt re: aphasia and provided aphasia ID card, however he did not have his daughter's phone number for the card. HW to generate list of personally relevant words for low tech AAC sheets. Will need to consider emergency call access. Pt is aware of paraphasias with rare min A.      Assessment / Recommendations / Plan   Plan Continue with current plan of care      Progression Toward Goals   Progression toward goals Progressing toward goals            SLP Education - 06/17/20 1420    Education Details compensations for aphasia; aphasia ed  Person(s) Educated Patient    Methods Explanation;Demonstration;Verbal cues;Handout    Comprehension Verbalized understanding;Returned demonstration;Verbal cues required;Tactile cues required            SLP Short Term Goals - 06/17/20 1421      SLP SHORT TERM GOAL #1   Title Pt will utilize compensations for aphasia 4/5 opportunities in  structured tasks with occasional min A over 2 sessions    Time 4    Period Weeks    Status On-going      SLP SHORT TERM GOAL #2   Title Pt will use written cues for phone conversations, food orders, doctor's office with occasional min A over 2 sessions    Time 4    Period Weeks    Status  On-going      SLP SHORT TERM GOAL #3   Title Pt will carryover 3 environmental compensations for aphasia with occasional min A.    Time 4    Period Weeks    Status On-going      SLP SHORT TERM GOAL #4   Title Pt will carryover compensatory strategies to keep track of items and report loosing items 3x or less over 5 days    Time 4    Period Weeks    Status On-going            SLP Long Term Goals - 06/17/20 1421      SLP LONG TERM GOAL #1   Title Pt will carryover compensations for aphasia during 15 minute moderately complex conversation with occasional min A    Time 8    Period Weeks    Status On-going      SLP LONG TERM GOAL #2   Title Pt will carryover compensations for memory when cooking, Gilbert Clark paying, managing medicaations with rare min A over 3 sessions    Time 8    Period Weeks    Status On-going      SLP LONG TERM GOAL #3   Title Pt will carryover 2 compensatory strategies to process and recall important/pertinent information from phone and in person conversations with occasional min A over 2 sessions    Time 8    Period Weeks    Status On-going            Plan - 06/17/20 1420    Clinical Impression Statement Gilbert Clark is referred for outpt ST due to difficulty with word finding and communication and mild memory issues. He is referred by PCP and arrives alone. Gilbert Clark reports that he "looses the word" when talking and he "looses all of the words if I'm nervous." He states this occurs thorughout the day resulting in frustration. Gilbert Clark also endorses some mild memory problems which he is compensating for sucessfully, including med management and Gilbert Clark paying. He makes sure to double check the stove and that his doors are locked at night. In conversation, Gilbert Clark has phonemic paraphasias (gum bubble for bubblegum; nammer for hammer) and has halting due to word finding. The CLQT revealed moderate language impairment and mild memory impairment. Attention,  executive functions, visual spatial skills and clock drawing were all WNL for his age. On the story retell, Gilbert Clark verbalized 3/18 story elements and recalled 2/6 details with yes/no questions. He endorses that following and recalling conversations is difficult and he has to "write it down right away" to recall pertinent information from a conversation or phone call. Generative naming was significantly impaired, Gilbert Clark named 10 animals in 1 minute and  3 "M" words in 1 minute. 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 His speech and language affect his ability to participate in these activities. I recommend skilled ST to maximize communication and cognition for safety, independence and QOL.    Speech Therapy Frequency 2x / week    Duration 8 weeks   17 visits   Treatment/Interventions Environmental controls;Language facilitation;SLP instruction and feedback;Compensatory strategies;Functional tasks;Cognitive reorganization;Compensatory techniques;Patient/family education;Multimodal communcation approach;Internal/external aids;Cueing hierarchy    Potential to Achieve Goals Good           Patient will benefit from skilled therapeutic intervention in order to improve the following deficits and impairments:   Aphasia  Cognitive communication deficit    Problem List There are no problems to display for this patient.   Gilbert Clark, Gilbert Clark, Gilbert Clark 06/17/2020, 2:22 PM  [facility] [address] [place], [place], [address] Phone: [phone]   Fax:  [phone]   Name: Gilbert Clark MRN: [id number] Date of Birth: June 25, 1935

## 2020-06-22 DIAGNOSIS — M25511 Pain in right shoulder: Secondary | ICD-10-CM | POA: Diagnosis not present

## 2020-06-22 DIAGNOSIS — M25561 Pain in right knee: Secondary | ICD-10-CM | POA: Diagnosis not present

## 2020-06-22 DIAGNOSIS — M25562 Pain in left knee: Secondary | ICD-10-CM | POA: Diagnosis not present

## 2020-06-22 DIAGNOSIS — M25512 Pain in left shoulder: Secondary | ICD-10-CM | POA: Diagnosis not present

## 2020-06-23 ENCOUNTER — Ambulatory Visit: Payer: Medicare Other

## 2020-06-23 ENCOUNTER — Other Ambulatory Visit: Payer: Self-pay

## 2020-06-23 DIAGNOSIS — E782 Mixed hyperlipidemia: Secondary | ICD-10-CM | POA: Diagnosis not present

## 2020-06-23 DIAGNOSIS — G8929 Other chronic pain: Secondary | ICD-10-CM | POA: Diagnosis not present

## 2020-06-23 DIAGNOSIS — R41841 Cognitive communication deficit: Secondary | ICD-10-CM

## 2020-06-23 DIAGNOSIS — R4701 Aphasia: Secondary | ICD-10-CM

## 2020-06-23 DIAGNOSIS — E039 Hypothyroidism, unspecified: Secondary | ICD-10-CM | POA: Diagnosis not present

## 2020-06-23 NOTE — Patient Instructions (Signed)
   Homework: Write down names of people in Psychiatric nurse and the [name] that you will rub shoulders with  Write down names of people in your family  Gilbert Clark the word tasks (worksheets)

## 2020-06-23 NOTE — Therapy (Signed)
[facility] [address] [place], [place], [address] Phone: [phone]   Fax:  [phone]  Speech Language Pathology Treatment  Patient Details  Name: Gilbert Clark MRN: [id number] Date of Birth: 1935/04/19 Referring Provider (SLP): Dr. Suszanne Finch   Encounter Date: 06/23/2020   End of Session - 06/23/20 1732    Visit Number 3    Number of Visits 17    Date for SLP Re-Evaluation 07/15/20    SLP Start Time 1105    SLP Stop Time  1145    SLP Time Calculation (min) 40 min    Activity Tolerance Patient tolerated treatment well           Past Medical History:  Diagnosis Date  . Anemia   . Bladder cancer ([place])   . BPH (benign prostatic hyperplasia)   . Fibromyalgia   . GERD (gastroesophageal reflux disease)   . High risk medication use   . Hx of blood clots   . Hyperlipidemia   . Hypothyroidism   . Memory loss   . Polymyalgia rheumatica ([place])   . Pulmonary embolism (H[facility])    hx of     Past Surgical History:  Procedure Laterality Date  . ACHILLES TENDON REPAIR  2004  . bilateral cataract surgery   2020  . CYSTOSCOPY W/ RETROGRADES Bilateral 01/21/2020   Procedure: CYSTOSCOPY WITH BILATERAL RETROGRADE;  Surgeon: Irine Seal, MD;  Location: [facility];  Service: Urology;  Laterality: Bilateral;  . IVC FILTER INSERTION      2000  . TRANSURETHRAL RESECTION OF BLADDER TUMOR WITH GYRUS (TURBT-GYRUS)     multiple times   . TRANSURETHRAL RESECTION OF BLADDER TUMOR WITH MITOMYCIN-C N/A 01/21/2020   Procedure: TRANSURETHRAL RESECTION OF BLADDER TUMOR WITH GEMCITABINE;  Surgeon: Irine Seal, MD;  Location: [facility];  Service: Urology;  Laterality: N/A;    There were no vitals filed for this visit.   Subjective Assessment - 06/23/20 1113    Subjective "I don't  know their last names." (re: pt's homework). SLP explained to have pt put setting in which he knows people and  their first names    Currently in Pain? No/denies                 ADULT SLP TREATMENT - 06/23/20 1119      General Information   Behavior/Cognition Alert;Cooperative;Pleasant mood      Treatment Provided   Treatment provided Cognitive-Linquistic      Cognitive-Linquistic Treatment   Treatment focused on Cognition;Aphasia    Skilled Treatment (speech tx) Assisted Gilbert Clark with his Aphasia ID card - told pt to write his emergency contact's number on his card, as it was cut off. 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 Pt reports that practicing the names of streets and his family have assisted in word recall - SLP highlighted that this technique will work with other things as well and again suggested pt write the names of people in his family. During conversation today pt had anomic event with "hike" and pt used gestural and semantic self-cues which were unsuccessful. SLP used phonemic cues with pt which were successful. SLP shared with pt to verbally generate and/or write three sentences using that word and then verbalize them to provide some semantic connections. Pt's sentences were with phonemic errors which were unnoticed until patient read  them, adn self corrected them. Gilbert Clark showed SLP one stimulus from his homework "that really threw me" - "town"- consistent max cues from SLP were needed for words associated with this word.      Assessment / Recommendations / Plan   Plan Continue with current plan of care      Progression Toward Goals   Progression toward goals Progressing toward goals            SLP Education - 06/23/20 1731    Education Details compensations for aphasia in certain situations, homework    Person(s) Educated Patient    Methods Explanation;Handout    Comprehension Verbalized understanding            SLP Short Term Goals - 06/23/20  1733      SLP SHORT TERM GOAL #1   Title Pt will utilize compensations for aphasia 4/5 opportunities in  structured tasks with occasional min A over 2 sessions    Time 3    Period Weeks    Status On-going      SLP SHORT TERM GOAL #2   Title Pt will use written cues for phone conversations, food orders, doctor's office with occasional min A over 2 sessions    Time 3    Period Weeks    Status On-going      SLP SHORT TERM GOAL #3   Title Pt will carryover 3 environmental compensations for aphasia with occasional min A.    Time 3    Period Weeks    Status On-going      SLP SHORT TERM GOAL #4   Title Pt will carryover compensatory strategies to keep track of items and report loosing items 3x or less over 5 days    Time 3    Period Weeks    Status On-going            SLP Long Term Goals - 06/23/20 1733      SLP LONG TERM GOAL #1   Title Pt will carryover compensations for aphasia during 15 minute moderately complex conversation with occasional min A    Time 7    Period Weeks    Status On-going      SLP LONG TERM GOAL #2   Title Pt will carryover compensations for memory when cooking, Gilbert Clark paying, managing medicaations with rare min A over 3 sessions    Time 7    Period Weeks    Status On-going      SLP LONG TERM GOAL #3   Title Pt will carryover 2 compensatory strategies to process and recall important/pertinent information from phone and in person conversations with occasional min A over 2 sessions    Time 7    Period Weeks    Status On-going            Plan - 06/23/20 1732    Clinical Impression Statement [name] is referred for outpt ST due to difficulty with word finding and communication and mild memory issues. 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 His speech and language affect his ability to participate in these activities. I recommend cont'd skilled ST to maximize communication and cognition for safety,  independence and QOL.    Speech Therapy Frequency 2x / week    Duration 8 weeks   17 visits   Treatment/Interventions Environmental controls;Language facilitation;SLP instruction and feedback;Compensatory strategies;Functional tasks;Cognitive reorganization;Compensatory techniques;Patient/family education;Multimodal communcation approach;Internal/external aids;Cueing hierarchy    Potential to Achieve Goals  Good           Patient will benefit from skilled therapeutic intervention in order to improve the following deficits and impairments:   Aphasia  Cognitive communication deficit    Problem List There are no problems to display for this patient.   [facility] ,[place], [place]  06/23/2020, 5:34 PM  [facility] [address] [place], [place], [address] Phone: [phone]   Fax:  [phone]   Name: Gilbert Clark MRN: [id number] Date of Birth: 06-13-1935

## 2020-06-26 ENCOUNTER — Other Ambulatory Visit: Payer: Self-pay

## 2020-06-26 ENCOUNTER — Ambulatory Visit: Payer: Medicare Other

## 2020-06-26 DIAGNOSIS — R4701 Aphasia: Secondary | ICD-10-CM

## 2020-06-26 DIAGNOSIS — R41841 Cognitive communication deficit: Secondary | ICD-10-CM

## 2020-06-26 NOTE — Patient Instructions (Signed)
  Semantic Feature Analysis framework

## 2020-06-26 NOTE — Therapy (Signed)
[facility] [address] [place], [place], [address] Phone: [phone]   Fax:  [phone]  Speech Language Pathology Treatment  Patient Details  Name: Gilbert Clark MRN: [id number] Date of Birth: 1936/02/08 Referring Provider (SLP): Dr. Suszanne Finch   Encounter Date: 06/26/2020   End of Session - 06/26/20 1403    Visit Number 4    Number of Visits 17    Date for SLP Re-Evaluation 07/15/20    SLP Start Time 9924    SLP Stop Time  1400    SLP Time Calculation (min) 43 min    Activity Tolerance Patient tolerated treatment well           Past Medical History:  Diagnosis Date  . Anemia   . Bladder cancer ([place])   . BPH (benign prostatic hyperplasia)   . Fibromyalgia   . GERD (gastroesophageal reflux disease)   . High risk medication use   . Hx of blood clots   . Hyperlipidemia   . Hypothyroidism   . Memory loss   . Polymyalgia rheumatica ([place])   . Pulmonary embolism ([facility])    hx of     Past Surgical History:  Procedure Laterality Date  . ACHILLES TENDON REPAIR  2004  . bilateral cataract surgery   2020  . CYSTOSCOPY W/ RETROGRADES Bilateral 01/21/2020   Procedure: CYSTOSCOPY WITH BILATERAL RETROGRADE;  Surgeon: Irine Seal, MD;  Location: [facility];  Service: Urology;  Laterality: Bilateral;  . IVC FILTER INSERTION      2000  . TRANSURETHRAL RESECTION OF BLADDER TUMOR WITH GYRUS (TURBT-GYRUS)     multiple times   . TRANSURETHRAL RESECTION OF BLADDER TUMOR WITH MITOMYCIN-C N/A 01/21/2020   Procedure: TRANSURETHRAL RESECTION OF BLADDER TUMOR WITH GEMCITABINE;  Surgeon: Irine Seal, MD;  Location: [facility];  Service: Urology;  Laterality: N/A;    There were no vitals filed for this visit.   Subjective Assessment - 06/26/20 1320    Subjective Pt's homework to write down names in organizations that he works with - pt does not work with >1 person in each of these  situations.    Currently in Pain? No/denies                 ADULT SLP TREATMENT - 06/26/20 1323      General Information   Behavior/Cognition Alert;Cooperative;Pleasant mood      Treatment Provided   Treatment provided Cognitive-Linquistic      Cognitive-Linquistic Treatment   Treatment focused on Cognition;Aphasia    Skilled Treatment Pt entered with description of the types of difficulty he's having - with questioning cues SLP ascertained that pt would like to work with street names, and things he does routinely. Pt gives example of the electric mower he just bought - he would like to be able to say whta this is and the brand name. SLP encouraged him to either write down the items/names/things he would like to practice, specifically, or he have family do this so that he can practice saying them. SLP also told pt that there are therapy techniques that can be worked on in session that will also have gneralization to other situations and words. SLP engaged pt in/educated pt in semantic feature analysis - pt req'd mod A ocassionally to complete the framework with accuracy. SLP provided a copy of the framework for pt to continue to use this on his own at home with assistance from family - SLP told  pt to use this on common words he has trouble with.      Assessment / Recommendations / Plan   Plan Continue with current plan of care      Progression Toward Goals   Progression toward goals Progressing toward goals            SLP Education - 06/26/20 1403    Education Details semantic feature analysis rationale and procedure    Person(s) Educated Patient    Methods Explanation;Demonstration;Handout;Verbal cues    Comprehension Verbalized understanding;Returned demonstration;Verbal cues required;Need further instruction            SLP Short Term Goals - 06/26/20 1404      SLP SHORT TERM GOAL #1   Title Pt will utilize compensations for aphasia 4/5 opportunities in  structured  tasks with occasional min A over 2 sessions    Time 3    Period Weeks    Status On-going      SLP SHORT TERM GOAL #2   Title Pt will use written cues for phone conversations, food orders, doctor's office with occasional min A over 2 sessions    Time 3    Period Weeks    Status On-going      SLP SHORT TERM GOAL #3   Title Pt will carryover 3 environmental compensations for aphasia with occasional min A.    Time 3    Period Weeks    Status On-going      SLP SHORT TERM GOAL #4   Title Pt will carryover compensatory strategies to keep track of items and report loosing items 3x or less over 5 days    Time 3    Period Weeks    Status On-going            SLP Long Term Goals - 06/26/20 1405      SLP LONG TERM GOAL #1   Title Pt will carryover compensations for aphasia during 15 minute moderately complex conversation with occasional min A    Time 7    Period Weeks    Status On-going      SLP LONG TERM GOAL #2   Title Pt will carryover compensations for memory when cooking, bill paying, managing medicaations with rare min A over 3 sessions    Time 7    Period Weeks    Status On-going      SLP LONG TERM GOAL #3   Title Pt will carryover 2 compensatory strategies to process and recall important/pertinent information from phone and in person conversations with occasional min A over 2 sessions    Time 7    Period Weeks    Status On-going            Plan - 06/26/20 1404    Clinical Impression Statement [name] is referred for outpt ST due to difficulty with word finding and communication and mild memory issues. 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 His speech and language affect his ability to participate in these andn other activities and conversation in the community. I recommend cont'd skilled ST to maximize communication and cognition for safety, independence and QOL.    Speech Therapy Frequency 2x / week    Duration 8  weeks   17 visits   Treatment/Interventions Environmental controls;Language facilitation;SLP instruction and feedback;Compensatory strategies;Functional tasks;Cognitive reorganization;Compensatory techniques;Patient/family education;Multimodal communcation approach;Internal/external aids;Cueing hierarchy    Potential to Achieve Goals Good  Patient will benefit from skilled therapeutic intervention in order to improve the following deficits and impairments:   Aphasia  Cognitive communication deficit    Problem List There are no problems to display for this patient.   [facility] ,[place], [place]  06/26/2020, 2:05 PM  [facility] [address] [place], [place], [address] Phone: [phone]   Fax:  [phone]   Name: Gilbert Clark MRN: [id number] Date of Birth: June 04, 1935

## 2020-06-29 ENCOUNTER — Encounter: Payer: Self-pay | Admitting: Speech Pathology

## 2020-06-29 ENCOUNTER — Other Ambulatory Visit: Payer: Self-pay

## 2020-06-29 ENCOUNTER — Ambulatory Visit: Payer: Medicare Other | Admitting: Speech Pathology

## 2020-06-29 DIAGNOSIS — R4701 Aphasia: Secondary | ICD-10-CM | POA: Diagnosis not present

## 2020-06-29 DIAGNOSIS — R41841 Cognitive communication deficit: Secondary | ICD-10-CM

## 2020-06-29 NOTE — Patient Instructions (Signed)
   We want to make a cheat sheet to help you find words easier. Add to the cheat sheet we provided and bring it in. We will keep adding to it  Use words, gestures to describe when you can't think of a word  See if Butch Penny can come into a couple of therapy sessions. I know it is her busy time

## 2020-06-29 NOTE — Therapy (Signed)
[facility] [address] [place], [place], [address] Phone: [phone]   Fax:  [phone]  Speech Language Pathology Treatment  Patient Details  Name: Gilbert Clark MRN: [id number] Date of Birth: June 06, 1935 Referring Provider (SLP): Dr. Suszanne Finch   Encounter Date: 06/29/2020   End of Session - 06/29/20 1600    Visit Number 5    Number of Visits 17    Date for SLP Re-Evaluation 07/15/20    SLP Start Time 1316    SLP Stop Time  1358    SLP Time Calculation (min) 42 min    Activity Tolerance Patient tolerated treatment well           Past Medical History:  Diagnosis Date  . Anemia   . Bladder cancer ([place])   . BPH (benign prostatic hyperplasia)   . Fibromyalgia   . GERD (gastroesophageal reflux disease)   . High risk medication use   . Hx of blood clots   . Hyperlipidemia   . Hypothyroidism   . Memory loss   . Polymyalgia rheumatica ([place])   . Pulmonary embolism (H[facility])    hx of     Past Surgical History:  Procedure Laterality Date  . ACHILLES TENDON REPAIR  2004  . bilateral cataract surgery   2020  . CYSTOSCOPY W/ RETROGRADES Bilateral 01/21/2020   Procedure: CYSTOSCOPY WITH BILATERAL RETROGRADE;  Surgeon: Irine Seal, MD;  Location: [facility];  Service: Urology;  Laterality: Bilateral;  . IVC FILTER INSERTION      2000  . TRANSURETHRAL RESECTION OF BLADDER TUMOR WITH GYRUS (TURBT-GYRUS)     multiple times   . TRANSURETHRAL RESECTION OF BLADDER TUMOR WITH MITOMYCIN-C N/A 01/21/2020   Procedure: TRANSURETHRAL RESECTION OF BLADDER TUMOR WITH GEMCITABINE;  Surgeon: Irine Seal, MD;  Location: [facility];  Service: Urology;  Laterality: N/A;    There were no vitals filed for this visit.   Subjective Assessment - 06/29/20 1326    Subjective "I want you to tell your friend, I had some stuff in my mouth" re: oral infection as cause of bad breath    Currently  in Pain? No/denies                 ADULT SLP TREATMENT - 06/29/20 1336      General Information   Behavior/Cognition Alert;Cooperative;Pleasant mood      Cognitive-Linquistic Treatment   Treatment focused on Cognition;Aphasia;Patient/family/caregiver education    Skilled Treatment Pt enters room without completing list of personally relvant words. Reviewed rationale for this as a cheat sheet for words he can't think of. Ongoing training of verbal compensations for aphasia using semantic feature analysis (SFA) in structured task. Gilbert Clark occasional min visual cues (pointing to SFA chart) and rare min questioning cues to generate salient descriptions of simple objects/animals. He spontaneously gestured x1 during structured task, I encouraged this as non verbal compensation to augment speech. Initiated low tech AAC with family names, places and jobs. Instructed Gilbert Clark co-workers (volunteers) and people he talks to daily, weekly and monthly, as well as places he goes weekly and monthly      Assessment / Recommendations / [place] with current plan of care      Progression Toward Goals   Progression toward goals Progressing toward goals              SLP Short Term Goals - 06/29/20 1559  SLP SHORT TERM GOAL #1   Title Pt will utilize compensations for aphasia 4/5 opportunities in  structured tasks with occasional min A over 2 sessions    Time 2    Period Weeks    Status On-going      SLP SHORT TERM GOAL #2   Title Pt will use written cues for phone conversations, food orders, doctor's office with occasional min A over 2 sessions    Time 2    Period Weeks    Status On-going      SLP SHORT TERM GOAL #3   Title Pt will carryover 3 environmental compensations for aphasia with occasional min A.    Time 2    Period Weeks    Status On-going      SLP SHORT TERM GOAL #4   Title Pt will carryover compensatory strategies to keep track of items and report  loosing items 3x or less over 5 days    Time 2    Period Weeks    Status On-going            SLP Long Term Goals - 06/29/20 1600      SLP LONG TERM GOAL #1   Title Pt will carryover compensations for aphasia during 15 minute moderately complex conversation with occasional min A    Time 6    Period Weeks    Status On-going      SLP LONG TERM GOAL #2   Title Pt will carryover compensations for memory when cooking, Gilbert Clark paying, managing medicaations with rare min A over 3 sessions    Time 6    Period Weeks    Status On-going      SLP LONG TERM GOAL #3   Title Pt will carryover 2 compensatory strategies to process and recall important/pertinent information from phone and in person conversations with occasional min A over 2 sessions    Time 6    Period Weeks    Status On-going            Plan - 06/29/20 1559    Clinical Impression Statement [name] is referred for outpt ST due to difficulty with word finding and communication and mild memory issues. 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 His speech and language affect his ability to participate in these andn other activities and conversation in the community. I recommend cont'd skilled ST to maximize communication and cognition for safety, independence and QOL.    Speech Therapy Frequency 2x / week    Duration 8 weeks   17 visits   Treatment/Interventions Environmental controls;Language facilitation;SLP instruction and feedback;Compensatory strategies;Functional tasks;Cognitive reorganization;Compensatory techniques;Patient/family education;Multimodal communcation approach;Internal/external aids;Cueing hierarchy    Potential to Achieve Goals Good           Patient will benefit from skilled therapeutic intervention in order to improve the following deficits and impairments:   Aphasia  Cognitive communication deficit    Problem List There are no problems to display for  this patient.   Gilbert Clark, Annye Rusk MS, CCC-SLP 06/29/2020, 4:01 PM  [facility] [address] [place], [place], [address] Phone: [phone]   Fax:  [phone]   Name: Gilbert Clark MRN: [id number] Date of Birth: [birth year]

## 2020-07-01 ENCOUNTER — Other Ambulatory Visit: Payer: Self-pay

## 2020-07-01 ENCOUNTER — Ambulatory Visit: Payer: Medicare Other

## 2020-07-01 DIAGNOSIS — R4701 Aphasia: Secondary | ICD-10-CM

## 2020-07-01 DIAGNOSIS — R41841 Cognitive communication deficit: Secondary | ICD-10-CM

## 2020-07-01 NOTE — Therapy (Signed)
[facility] [address] [place], [place], [address] Phone: [phone]   Fax:  [phone]  Speech Language Pathology Treatment  Patient Details  Name: Gilbert Clark MRN: [id number] Date of Birth: 10-Mar-1936 Referring Provider (SLP): Dr. Suszanne Finch   Encounter Date: 07/01/2020   End of Session - 07/01/20 0600    Visit Number 6    Number of Visits 17    Date for SLP Re-Evaluation 07/15/20    SLP Start Time 1447    SLP Stop Time  5537    SLP Time Calculation (min) 43 min    Activity Tolerance Patient tolerated treatment well           Past Medical History:  Diagnosis Date  . Anemia   . Bladder cancer ([place])   . BPH (benign prostatic hyperplasia)   . Fibromyalgia   . GERD (gastroesophageal reflux disease)   . High risk medication use   . Hx of blood clots   . Hyperlipidemia   . Hypothyroidism   . Memory loss   . Polymyalgia rheumatica ([place])   . Pulmonary embolism ([facility])    hx of     Past Surgical History:  Procedure Laterality Date  . ACHILLES TENDON REPAIR  2004  . bilateral cataract surgery   2020  . CYSTOSCOPY W/ RETROGRADES Bilateral 01/21/2020   Procedure: CYSTOSCOPY WITH BILATERAL RETROGRADE;  Surgeon: Irine Seal, MD;  Location: [facility];  Service: Urology;  Laterality: Bilateral;  . IVC FILTER INSERTION      2000  . TRANSURETHRAL RESECTION OF BLADDER TUMOR WITH GYRUS (TURBT-GYRUS)     multiple times   . TRANSURETHRAL RESECTION OF BLADDER TUMOR WITH MITOMYCIN-C N/A 01/21/2020   Procedure: TRANSURETHRAL RESECTION OF BLADDER TUMOR WITH GEMCITABINE;  Surgeon: Irine Seal, MD;  Location: [facility];  Service: Urology;  Laterality: N/A;    There were no vitals filed for this visit.          ADULT SLP TREATMENT - 07/01/20 1703      General Information   Behavior/Cognition Alert;Cooperative;Pleasant mood;Requires cueing      Treatment Provided    Treatment provided Cognitive-Linquistic      Cognitive-Linquistic Treatment   Treatment focused on Aphasia    Skilled Treatment Pt completed a list of personally relvant words for [name]. Ongoing training of verbal compensations for aphasia using semantic feature analysis (SFA) in structured task with "select". Pt's SFA was copmleted with vague words, when, once pt was asked was able to generate more descriptive responses with mod A. Ongoing training also with nonverbal compensations such as using maps app on his phone. He spontaneously gestured x1 during structured task with 2 SLP requests for more information. Ongoing therapy increasing pt's awareness of errors was enhanced by written cues by SLP verbatim what pt stated. SLP worked with pt with low-tech AAC with familiar places - pt wrote names of stores/places correctly 85% (functionally 100%). Family details were completed with extra time - pt used a synonym strategy. SLP encouarged pt to write down names of friends to add to low-tech AAC as a cheat sheet for when he cannot verbalize names of friends.      Assessment / Recommendations / Plan   Plan Continue with current plan of care      Progression Toward Goals   Progression toward goals Progressing toward goals            SLP Education - 07/01/20 1710  Education Details compensations for anomia, increasing pt awareness of errors    Person(s) Educated Patient    Methods Explanation;Demonstration;Handout    Comprehension Verbalized understanding;Verbal cues required;Need further instruction            SLP Short Term Goals - 07/01/20 1712      SLP SHORT TERM GOAL #1   Title Pt will utilize compensations for aphasia 4/5 opportunities in  structured tasks with occasional min A over 2 sessions    Baseline 07-01-20    Time 2    Period Weeks    Status On-going      SLP SHORT TERM GOAL #2   Title Pt will use written cues for phone conversations, food orders, doctor's  office with occasional min A over 2 sessions    Time 2    Period Weeks    Status On-going      SLP SHORT TERM GOAL #3   Title Pt will carryover 3 environmental compensations for aphasia with occasional min A.    Time 2    Period Weeks    Status On-going      SLP SHORT TERM GOAL #4   Title Pt will carryover compensatory strategies to keep track of items and report loosing items 3x or less over 5 days    Time 2    Period Weeks    Status On-going            SLP Long Term Goals - 07/01/20 1712      SLP LONG TERM GOAL #1   Title Pt will carryover compensations for aphasia during 15 minute moderately complex conversation with occasional min A    Time 6    Period Weeks    Status On-going      SLP LONG TERM GOAL #2   Title Pt will carryover compensations for memory when cooking, bill paying, managing medicaations with rare min A over 3 sessions    Time 6    Period Weeks    Status On-going      SLP LONG TERM GOAL #3   Title Pt will carryover 2 compensatory strategies to process and recall important/pertinent information from phone and in person conversations with occasional min A over 2 sessions    Time 6    Period Weeks    Status On-going            Plan - 07/01/20 1711    Clinical Impression Statement Gilbert Clark is referred for outpt ST due to difficulty with word finding and communication and mild memory issues. 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 His speech and language affect his ability to participate in these andn other activities and conversation in the community. I recommend cont'd skilled ST to maximize communication and cognition for safety, independence and QOL.    Speech Therapy Frequency 2x / week    Duration 8 weeks   17 visits   Treatment/Interventions Environmental controls;Language facilitation;SLP instruction and feedback;Compensatory strategies;Functional tasks;Cognitive reorganization;Compensatory  techniques;Patient/family education;Multimodal communcation approach;Internal/external aids;Cueing hierarchy    Potential to Achieve Goals Good           Patient will benefit from skilled therapeutic intervention in order to improve the following deficits and impairments:   Aphasia  Cognitive communication deficit    Problem List There are no problems to display for this patient.   [facility] ,[place], [place]  07/01/2020, 5:12 PM  [facility] [address]  [place], [place], [address] Phone: [phone]   Fax:  [phone]   Name: Gilbert Clark MRN: [id number] Date of Birth: Apr 29, 1935

## 2020-07-06 ENCOUNTER — Other Ambulatory Visit: Payer: Self-pay

## 2020-07-06 ENCOUNTER — Ambulatory Visit: Payer: Medicare Other

## 2020-07-06 DIAGNOSIS — R4701 Aphasia: Secondary | ICD-10-CM

## 2020-07-06 DIAGNOSIS — R41841 Cognitive communication deficit: Secondary | ICD-10-CM

## 2020-07-06 NOTE — Therapy (Signed)
[facility] [address] [place], [place], [address] Phone: [phone]   Fax:  [phone]  Speech Language Pathology Treatment  Patient Details  Name: Gilbert Clark MRN: [id number] Date of Birth: 1935/06/06 Referring Provider (SLP): Dr. Suszanne Finch   Encounter Date: 07/06/2020   End of Session - 07/06/20 1627    Visit Number 7    Number of Visits 17    Date for SLP Re-Evaluation 07/15/20    SLP Start Time 1450    SLP Stop Time  6834    SLP Time Calculation (min) 40 min    Activity Tolerance Patient tolerated treatment well           Past Medical History:  Diagnosis Date  . Anemia   . Bladder cancer ([place])   . BPH (benign prostatic hyperplasia)   . Fibromyalgia   . GERD (gastroesophageal reflux disease)   . High risk medication use   . Hx of blood clots   . Hyperlipidemia   . Hypothyroidism   . Memory loss   . Polymyalgia rheumatica ([place])   . Pulmonary embolism ([facility])    hx of     Past Surgical History:  Procedure Laterality Date  . ACHILLES TENDON REPAIR  2004  . bilateral cataract surgery   2020  . CYSTOSCOPY W/ RETROGRADES Bilateral 01/21/2020   Procedure: CYSTOSCOPY WITH BILATERAL RETROGRADE;  Surgeon: Irine Seal, MD;  Location: [facility];  Service: Urology;  Laterality: Bilateral;  . IVC FILTER INSERTION      2000  . TRANSURETHRAL RESECTION OF BLADDER TUMOR WITH GYRUS (TURBT-GYRUS)     multiple times   . TRANSURETHRAL RESECTION OF BLADDER TUMOR WITH MITOMYCIN-C N/A 01/21/2020   Procedure: TRANSURETHRAL RESECTION OF BLADDER TUMOR WITH GEMCITABINE;  Surgeon: Irine Seal, MD;  Location: [facility];  Service: Urology;  Laterality: N/A;    There were no vitals filed for this visit.   Subjective Assessment - 07/06/20 1507    Subjective Pt endorses that pressure to talk makes it more challenging to have fluid verbal expression.    Currently in Pain?  No/denies                 ADULT SLP TREATMENT - 07/06/20 1508      General Information   Behavior/Cognition Alert;Cooperative;Pleasant mood;Requires cueing      Treatment Provided   Treatment provided Cognitive-Linquistic      Cognitive-Linquistic Treatment   Treatment focused on Aphasia    Skilled Treatment Bill shares with SLP that his nerves make it more difficult (see "s" statement) to verbally communiate. Pt completed his homework for listing friends' names. SLP had pt describe his places today - occasional min cues necessary for adequate descriptions for grocery stores. For friends, pt req'd extra time - much better success with his descriptions of friends.      Assessment / Recommendations / Plan   Plan Continue with current plan of care      Progression Toward Goals   Progression toward goals Progressing toward goals              SLP Short Term Goals - 07/06/20 1629      SLP SHORT TERM GOAL #1   Title Pt will utilize compensations for aphasia 4/5 opportunities in  structured tasks with occasional min A over 2 sessions    Baseline 07-01-20    Time 1    Period Weeks    Status On-going  SLP SHORT TERM GOAL #2   Title Pt will use written cues for phone conversations, food orders, doctor's office with occasional min A over 2 sessions    Time 1    Period Weeks    Status On-going      SLP SHORT TERM GOAL #3   Title Pt will carryover 3 environmental compensations for aphasia with occasional min A.    Time 1    Period Weeks    Status On-going      SLP SHORT TERM GOAL #4   Title Pt will carryover compensatory strategies to keep track of items and report loosing items 3x or less over 5 days    Time 1    Period Weeks    Status On-going            SLP Long Term Goals - 07/06/20 1632      SLP LONG TERM GOAL #1   Title Pt will carryover compensations for aphasia during 15 minute moderately complex conversation with occasional min A    Time 5     Period Weeks    Status On-going      SLP LONG TERM GOAL #2   Title Pt will carryover compensations for memory when cooking, bill paying, managing medicaations with rare min A over 3 sessions    Time 5    Period Weeks    Status On-going      SLP LONG TERM GOAL #3   Title Pt will carryover 2 compensatory strategies to process and recall important/pertinent information from phone and in person conversations with occasional min A over 2 sessions    Time 5    Period Weeks    Status On-going            Plan - 07/06/20 1629    Clinical Impression Statement Gilbert Clark is referred for outpt ST due to difficulty with word finding and communication and mild memory issues. 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 His speech and language affect his ability to participate in these andn other activities and conversation in the community. I recommend cont'd skilled ST to maximize communication and cognition for safety, independence and QOL.    Speech Therapy Frequency 2x / week    Duration 8 weeks   17 visits   Treatment/Interventions Environmental controls;Language facilitation;SLP instruction and feedback;Compensatory strategies;Functional tasks;Cognitive reorganization;Compensatory techniques;Patient/family education;Multimodal communcation approach;Internal/external aids;Cueing hierarchy    Potential to Achieve Goals Good           Patient will benefit from skilled therapeutic intervention in order to improve the following deficits and impairments:   Cognitive communication deficit  Aphasia    Problem List There are no problems to display for this patient.   [facility] ,[place], [place]  07/06/2020, 4:33 PM  [facility] [address] [place], [place], [address] Phone: [phone]   Fax:  [phone]   Name: Gilbert Clark MRN: [id number] Date of Birth: 1936-03-17

## 2020-07-08 ENCOUNTER — Ambulatory Visit: Payer: Medicare Other

## 2020-07-13 ENCOUNTER — Other Ambulatory Visit: Payer: Self-pay

## 2020-07-13 ENCOUNTER — Ambulatory Visit: Payer: Medicare Other

## 2020-07-13 DIAGNOSIS — R41841 Cognitive communication deficit: Secondary | ICD-10-CM

## 2020-07-13 DIAGNOSIS — R4701 Aphasia: Secondary | ICD-10-CM

## 2020-07-13 NOTE — Therapy (Signed)
[facility] [address] [place], [place], [address] Phone: [phone]   Fax:  [phone]  Speech Language Pathology Treatment  Patient Details  Name: Gilbert Clark MRN: [id number] Date of Birth: 12-Mar-1936 Referring Provider (SLP): Dr. Suszanne Finch   Encounter Date: 07/13/2020   End of Session - 07/13/20 1438    Visit Number 8    Number of Visits 17    Date for SLP Re-Evaluation 07/15/20    SLP Start Time 1319    SLP Stop Time  1400    SLP Time Calculation (min) 41 min    Activity Tolerance Patient tolerated treatment well           Past Medical History:  Diagnosis Date  . Anemia   . Bladder cancer ([place])   . BPH (benign prostatic hyperplasia)   . Fibromyalgia   . GERD (gastroesophageal reflux disease)   . High risk medication use   . Hx of blood clots   . Hyperlipidemia   . Hypothyroidism   . Memory loss   . Polymyalgia rheumatica ([place])   . Pulmonary embolism ([facility])    hx of     Past Surgical History:  Procedure Laterality Date  . ACHILLES TENDON REPAIR  2004  . bilateral cataract surgery   2020  . CYSTOSCOPY W/ RETROGRADES Bilateral 01/21/2020   Procedure: CYSTOSCOPY WITH BILATERAL RETROGRADE;  Surgeon: Irine Seal, MD;  Location: [facility];  Service: Urology;  Laterality: Bilateral;  . IVC FILTER INSERTION      2000  . TRANSURETHRAL RESECTION OF BLADDER TUMOR WITH GYRUS (TURBT-GYRUS)     multiple times   . TRANSURETHRAL RESECTION OF BLADDER TUMOR WITH MITOMYCIN-C N/A 01/21/2020   Procedure: TRANSURETHRAL RESECTION OF BLADDER TUMOR WITH GEMCITABINE;  Surgeon: Irine Seal, MD;  Location: [facility];  Service: Urology;  Laterality: N/A;    There were no vitals filed for this visit.   Subjective Assessment - 07/13/20 1323    Subjective "I think- that-  I discovered -over the weekend that I need to -read more."    Currently in Pain? No/denies                  ADULT SLP TREATMENT - 07/13/20 1324      General Information   Behavior/Cognition Alert;Cooperative;Pleasant mood;Requires cueing      Treatment Provided   Treatment provided Cognitive-Linquistic      Cognitive-Linquistic Treatment   Treatment focused on Aphasia;Cognition    Skilled Treatment SLP reviewed pt's homework. Pt did not complete as asked- to type the names of friends, so SLP had pt type them today. Gilbert Clark did so without diffiuclty. "That way I have things to practice now," pt said. SLP also re-explained to Gilbert Clark that he can use these sheets if he has diffculty with generating names/places and he and SLP did some role play with this to focus pt's rationale for low-tech AAC that he can use to assist his communication. Gilbert Clark had difficulty verbalizing the answer to SLP ? 4/6, and for each of the 4 responses he could not verbalize correctly, he did not look in his folder at the target word to assist verbalization unless cued to do so. SLP also reviewed with pt that his aphasia affects reading, writing, speaking, and understanding - given pt's "s" statement.      Assessment / Recommendations / Plan   Plan Continue with current plan of care      Progression Toward  Goals   Progression toward goals Progressing toward goals            SLP Education - 07/13/20 1438    Education Details what is aphasia, what low-tech AAC is for    Person(s) Educated Patient    Methods Explanation;Handout;Demonstration    Comprehension Verbalized understanding;Returned demonstration;Verbal cues required            SLP Short Term Goals - 07/13/20 1439      SLP SHORT TERM GOAL #1   Title Pt will utilize compensations for aphasia 4/5 opportunities in  structured tasks with occasional min A over 2 sessions    Baseline 07-01-20    Time 1    Period Weeks    Status Partially Met      SLP SHORT TERM GOAL #2   Title Pt will use written cues for phone conversations, food orders, doctor's  office with occasional min A over 2 sessions    Time 1    Period Weeks    Status Not Met      SLP SHORT TERM GOAL #3   Title Pt will carryover 3 environmental compensations for aphasia with occasional min A.    Time 1    Period Weeks    Status Not Met      SLP SHORT TERM GOAL #4   Title Pt will carryover compensatory strategies to keep track of items and report loosing items 3x or less over 5 days    Time 1    Period Weeks    Status Deferred            SLP Long Term Goals - 07/13/20 1440      SLP LONG TERM GOAL #1   Title Pt will carryover compensations for aphasia during 15 minute moderately complex conversation with occasional min A    Time 4    Period Weeks    Status On-going      SLP LONG TERM GOAL #2   Title Pt will carryover compensations for memory when cooking, Gilbert Clark paying, managing medicaations with rare min A over 3 sessions    Time 4    Period Weeks    Status On-going      SLP LONG TERM GOAL #3   Title Pt will carryover 2 compensatory strategies to process and recall important/pertinent information from phone and in person conversations with occasional min A over 2 sessions    Time 4    Period Weeks    Status On-going            Plan - 07/13/20 1439    Clinical Impression Statement [name] is referred for outpt ST due to difficulty with word finding and communication and mild memory issues. He thought his book was for practice only, cues necessary for Gilbert Clark to realize he can use the AAC book to communicate as well. 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 His speech and language affect his ability to participate in these andn other activities and conversation in the community. I recommend cont'd skilled ST to maximize communication and cognition for safety, independence and QOL.    Speech Therapy Frequency 2x / week    Duration 8 weeks   17 visits   Treatment/Interventions Environmental controls;Language  facilitation;SLP instruction and feedback;Compensatory strategies;Functional tasks;Cognitive reorganization;Compensatory techniques;Patient/family education;Multimodal communcation approach;Internal/external aids;Cueing hierarchy    Potential to Achieve Goals Good           Patient will benefit from  skilled therapeutic intervention in order to improve the following deficits and impairments:   Aphasia  Cognitive communication deficit    Problem List There are no problems to display for this patient.   [facility] ,[place], [place]  07/13/2020, 2:42 PM  [facility] [address] [place], [place], [address] Phone: [phone]   Fax:  [phone]   Name: Gilbert Clark MRN: [id number] Date of Birth: [birth year]

## 2020-07-15 ENCOUNTER — Ambulatory Visit: Payer: Medicare Other

## 2020-07-15 ENCOUNTER — Other Ambulatory Visit: Payer: Self-pay

## 2020-07-15 DIAGNOSIS — R41841 Cognitive communication deficit: Secondary | ICD-10-CM

## 2020-07-15 DIAGNOSIS — R4701 Aphasia: Secondary | ICD-10-CM

## 2020-07-15 NOTE — Patient Instructions (Addendum)
   Ways to compensate for finding the word you want: 1) Use another word that means the same thing - a synonym 2) Describe the thing you are trying to say (color, shape, composition, size, function, etc) 3) Say a different sentence and don't use the word you can't find  4) Gesturing 5) Drawing

## 2020-07-15 NOTE — Therapy (Signed)
[facility] [address] [place], [place], [address] Phone: [phone]   Fax:  [phone]  Speech Language Pathology Treatment  Patient Details  Name: Gilbert Clark MRN: [id number] Date of Birth: Oct 28, 1935 Referring Provider (SLP): Dr. Suszanne Finch   Encounter Date: 07/15/2020   End of Session - 07/15/20 1402    Visit Number 9    Number of Visits 17    Date for SLP Re-Evaluation 07/15/20    SLP Start Time 31    SLP Stop Time  1400    SLP Time Calculation (min) 42 min    Activity Tolerance Patient tolerated treatment well           Past Medical History:  Diagnosis Date  . Anemia   . Bladder cancer ([place])   . BPH (benign prostatic hyperplasia)   . Fibromyalgia   . GERD (gastroesophageal reflux disease)   . High risk medication use   . Hx of blood clots   . Hyperlipidemia   . Hypothyroidism   . Memory loss   . Polymyalgia rheumatica ([place])   . Pulmonary embolism ([facility])    hx of     Past Surgical History:  Procedure Laterality Date  . ACHILLES TENDON REPAIR  2004  . bilateral cataract surgery   2020  . CYSTOSCOPY W/ RETROGRADES Bilateral 01/21/2020   Procedure: CYSTOSCOPY WITH BILATERAL RETROGRADE;  Surgeon: Irine Seal, MD;  Location: [facility];  Service: Urology;  Laterality: Bilateral;  . IVC FILTER INSERTION      2000  . TRANSURETHRAL RESECTION OF BLADDER TUMOR WITH GYRUS (TURBT-GYRUS)     multiple times   . TRANSURETHRAL RESECTION OF BLADDER TUMOR WITH MITOMYCIN-C N/A 01/21/2020   Procedure: TRANSURETHRAL RESECTION OF BLADDER TUMOR WITH GEMCITABINE;  Surgeon: Irine Seal, MD;  Location: [facility];  Service: Urology;  Laterality: N/A;    There were no vitals filed for this visit.   Subjective Assessment - 07/15/20 1330    Subjective "I start to say this thing thing and it doesn't work."    Currently in Pain? No/denies                 ADULT  SLP TREATMENT - 07/15/20 1330      General Information   Behavior/Cognition Alert;Cooperative;Pleasant mood;Requires cueing      Treatment Provided   Treatment provided Cognitive-Linquistic      Cognitive-Linquistic Treatment   Treatment focused on Aphasia;Cognition    Skilled Treatment Gilbert Clark related to me that anxiety about word finding makes it worse. Daugter confirmed that with pt. "I'm sitting here with you and - I'm doing pretty darn good.Marland KitchenMarland KitchenMarland KitchenIt helps me to be -to be in a calm, a calm conversation. I'm probably a 63 purse (percent)." SLP reviewed compensations today with pt and provided examples of each compensation with pt - these examples were especially successful with circumlocution, gesturing and drawing, given extra time for pt to understand the meaning of each.      Assessment / Recommendations / Plan   Plan Continue with current plan of care      Progression Toward Goals   Progression toward goals Progressing toward goals            SLP Education - 07/15/20 1401    Education Details compensatory measures    Person(s) Educated Patient    Methods Explanation;Handout;Demonstration;Verbal cues    Comprehension Verbalized understanding;Returned demonstration;Verbal cues required  SLP Short Term Goals - 07/15/20 1327      SLP SHORT TERM GOAL #1   Title Pt will utilize compensations for aphasia 4/5 opportunities in  structured tasks with occasional min A over 2 sessions    Baseline 07-01-20    Status Partially Met      SLP SHORT TERM GOAL #2   Title Pt will use written cues for phone conversations, food orders, doctor's office with occasional min A over 2 sessions    Status Not Met      SLP SHORT TERM GOAL #3   Title Pt will carryover 3 environmental compensations for aphasia with occasional min A.    Status Not Met      SLP SHORT TERM GOAL #4   Title Pt will carryover compensatory strategies to keep track of items and report loosing items 3x or less over  5 days    Status Deferred            SLP Long Term Goals - 07/15/20 1328      SLP LONG TERM GOAL #1   Title Pt will carryover compensations for aphasia during 15 minute moderately complex conversation with occasional min A    Time 4    Period Weeks    Status On-going      SLP LONG TERM GOAL #2   Title Pt will carryover compensations for memory when cooking, Gilbert Clark paying, managing medicaations with rare min A over 3 sessions    Time 4    Period Weeks    Status On-going      SLP LONG TERM GOAL #3   Title Pt will carryover 2 compensatory strategies to process and recall important/pertinent information from phone and in person conversations with occasional min A over 2 sessions    Time 4    Period Weeks    Status On-going            Plan - 07/15/20 1402    Clinical Impression Statement Gilbert Clark is referred for outpt ST due to difficulty with word finding and communication and mild memory issues. Cues continue for Gilbert Clark to realize he can use the AAC book to communicate instead of just practice verbalization. 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 His speech and language affect his ability to participate in these andn other activities and conversation in the community. I recommend cont'd skilled ST to maximize communication and cognition for safety, independence and QOL.    Speech Therapy Frequency 2x / week    Duration 8 weeks   17 visits   Treatment/Interventions Environmental controls;Language facilitation;SLP instruction and feedback;Compensatory strategies;Functional tasks;Cognitive reorganization;Compensatory techniques;Patient/family education;Multimodal communcation approach;Internal/external aids;Cueing hierarchy    Potential to Achieve Goals Good           Patient will benefit from skilled therapeutic intervention in order to improve the following deficits and impairments:   Aphasia  Cognitive communication  deficit    Problem List There are no problems to display for this patient.   [facility] ,[place], [place]  07/15/2020, 2:03 PM  [facility] [address] [place], [place], [address] Phone: [phone]   Fax:  [phone]   Name: Gilbert Clark MRN: [id number] Date of Birth: 05-May-1935

## 2020-07-20 ENCOUNTER — Ambulatory Visit: Payer: Medicare Other | Attending: Family Medicine

## 2020-07-20 ENCOUNTER — Other Ambulatory Visit: Payer: Self-pay

## 2020-07-20 DIAGNOSIS — R41841 Cognitive communication deficit: Secondary | ICD-10-CM | POA: Insufficient documentation

## 2020-07-20 DIAGNOSIS — R4701 Aphasia: Secondary | ICD-10-CM | POA: Insufficient documentation

## 2020-07-20 NOTE — Therapy (Addendum)
[facility] [address] [place], [place], [address] Phone: [phone]   Fax:  [phone]  Speech Language Pathology Treatment/progress note  Patient Details  Name: Clark Clark MRN: [id number] Date of Birth: 1936-04-07 Referring Provider (SLP): Dr. Suszanne Clark   Encounter Date: 07/20/2020   End of Session - 07/20/20 1447    Visit Number 10    Number of Visits 17    Date for SLP Re-Evaluation 10/18/20    SLP Start Time 3    SLP Stop Time  1400    SLP Time Calculation (min) 42 min    Activity Tolerance Patient tolerated treatment well           Past Medical History:  Diagnosis Date  . Anemia   . Bladder cancer ([place])   . BPH (benign prostatic hyperplasia)   . Fibromyalgia   . GERD (gastroesophageal reflux disease)   . High risk medication use   . Hx of blood clots   . Hyperlipidemia   . Hypothyroidism   . Memory loss   . Polymyalgia rheumatica ([place])   . Pulmonary embolism ([facility])    hx of     Past Surgical History:  Procedure Laterality Date  . ACHILLES TENDON REPAIR  2004  . bilateral cataract surgery   2020  . CYSTOSCOPY W/ RETROGRADES Bilateral 01/21/2020   Procedure: CYSTOSCOPY WITH BILATERAL RETROGRADE;  Surgeon: Gilbert Seal, MD;  Location: [facility];  Service: Urology;  Laterality: Bilateral;  . IVC FILTER INSERTION      2000  . TRANSURETHRAL RESECTION OF BLADDER TUMOR WITH GYRUS (TURBT-GYRUS)     multiple times   . TRANSURETHRAL RESECTION OF BLADDER TUMOR WITH MITOMYCIN-C N/A 01/21/2020   Procedure: TRANSURETHRAL RESECTION OF BLADDER TUMOR WITH GEMCITABINE;  Surgeon: Gilbert Seal, MD;  Location: [facility];  Service: Urology;  Laterality: N/A;    There were no vitals filed for this visit.  Speech Therapy Progress Note  Dates of Reporting Period: 05-20-20 to present  Subjective Statement: Pt has improved his ability to use compensation  measures.  Objective: See below  Goal Update: See below  Plan: See below  Reason Skilled Services are Required: Pt has not reached max potential.      Subjective Assessment - 07/20/20 1323    Subjective "I say eff-uh-see-yah (aphasia)."    Currently in Pain? Yes    Pain Location Shoulder    Pain Orientation Left    Pain Descriptors / Indicators Sore    Pain Type Chronic pain    Pain Onset More than a month ago    Pain Frequency Constant                 ADULT SLP TREATMENT - 07/20/20 1327      General Information   Behavior/Cognition Alert;Cooperative;Pleasant mood;Requires cueing      Treatment Provided   Treatment provided Cognitive-Linquistic      Cognitive-Linquistic Treatment   Treatment focused on Aphasia;Cognition    Skilled Treatment "I was putting the thing on the uh, gadget." Clark Clark indicates he has more difficulty with his speech with more anxiety, which is troublesome for pt. Pt indicated he used his blue noteook over the weekend for finding compensations, however, Clark Clark used the words in which compensations were described for PRACTICE generating sentences. (e.g., "At the gym, Clark Clark did a new gesture last night. Clark Clark did a new movement last night."). SLP reviewed what compensations were with examples, and he req'd  multiple explanations before he finally understood. He told SLP that he had difficulty finding names for major streets in [place], and neighbors' names. SLP suggested pt writing down the neighbor's names on a map/paper of his neighborhood and practicing them.SLP      Assessment / Recommendations / Plan   Plan Continue with current plan of care      Progression Toward Goals   Progression toward goals Progressing toward goals            SLP Education - 07/20/20 1447    Education Details reviewd compensations for anomia    Person(s) Educated Patient    Methods Explanation;Demonstration;Verbal cues;Handout   written cues   Comprehension Verbalized  understanding;Returned demonstration;Need further instruction            SLP Short Term Goals - 07/15/20 1327      SLP SHORT TERM GOAL #1   Title Pt will utilize compensations for aphasia 4/5 opportunities in  structured tasks with occasional min A over 2 sessions    Baseline 07-01-20    Status Partially Met      SLP SHORT TERM GOAL #2   Title Pt will use written cues for phone conversations, food orders, doctor's office with occasional min A over 2 sessions    Status Not Met      SLP SHORT TERM GOAL #3   Title Pt will carryover 3 environmental compensations for aphasia with occasional min A.    Status Not Met      SLP SHORT TERM GOAL #4   Title Pt will carryover compensatory strategies to keep track of items and report loosing items 3x or less over 5 days    Status Deferred            SLP Long Term Goals - 07/20/20 1449      SLP LONG TERM GOAL #1   Title Pt will carryover compensations for aphasia during 15 minute moderately complex conversation with occasional min A    Time 3    Period Weeks    Status On-going      SLP LONG TERM GOAL #2   Title Pt will carryover compensations for memory when cooking, Clark Clark paying, managing medicaations with rare min A over 3 sessions    Time 3    Period Weeks    Status On-going      SLP LONG TERM GOAL #3   Title Pt will carryover 2 compensatory strategies to process and recall important/pertinent information from phone and in person conversations with occasional min A over 2 sessions    Time 3    Period Weeks    Status On-going            Plan - 07/20/20 1448    Clinical Impression Statement Mr. Gilbert "Clark Clark" Clark is referred for outpt ST due to difficulty with word finding and communication and mild memory issues. Cues continue for Clark Clark to realize he can use the AAC book to communicate instead of just practice verbalization; today he req'd max A re: compensations for anomia, and that these were not homework for pt. 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 His speech and language affect his ability to participate in these andn other activities and conversation in the community. I recommend cont'd skilled ST to maximize communication and cognition for safety, independence and QOL.    Speech Therapy Frequency 2x / week    Duration 8 weeks   17 visits  Treatment/Interventions Environmental controls;Language facilitation;SLP instruction and feedback;Compensatory strategies;Functional tasks;Cognitive reorganization;Compensatory techniques;Patient/family education;Multimodal communcation approach;Internal/external aids;Cueing hierarchy    Potential to Achieve Goals Good           Patient will benefit from skilled therapeutic intervention in order to improve the following deficits and impairments:   Aphasia  Cognitive communication deficit    Problem List There are no problems to display for this patient.   [facility] ,[place], [place]  07/20/2020, 2:49 PM  [facility] [address] [place], [place], [address] Phone: [phone]   Fax:  [phone]   Name: Clark Clark MRN: [id number] Date of Birth: Jul 05, 1935

## 2020-07-22 ENCOUNTER — Ambulatory Visit: Payer: Medicare Other

## 2020-07-22 ENCOUNTER — Other Ambulatory Visit: Payer: Self-pay

## 2020-07-22 DIAGNOSIS — R41841 Cognitive communication deficit: Secondary | ICD-10-CM

## 2020-07-22 DIAGNOSIS — R4701 Aphasia: Secondary | ICD-10-CM | POA: Diagnosis not present

## 2020-07-22 NOTE — Addendum Note (Signed)
Addended by: Garald Balding B on: 07/22/2020 02:38 PM   Modules accepted: Orders

## 2020-07-22 NOTE — Therapy (Addendum)
[facility] [address] [place], [place], [address] Phone: [phone]   Fax:  [phone]  Speech Language Pathology Treatment  Patient Details  Name: Gilbert Clark MRN: [id number] Date of Birth: 1936-04-10 Referring Provider (SLP): Dr. Suszanne Finch   Encounter Date: 07/22/2020   End of Session - 07/22/20 1431    Visit Number 11    Number of Visits 17    Date for SLP Re-Evaluation 10/18/20    SLP Start Time 34    SLP Stop Time  1400    SLP Time Calculation (min) 42 min    Activity Tolerance Patient tolerated treatment well           Past Medical History:  Diagnosis Date  . Anemia   . Bladder cancer ([place])   . BPH (benign prostatic hyperplasia)   . Fibromyalgia   . GERD (gastroesophageal reflux disease)   . High risk medication use   . Hx of blood clots   . Hyperlipidemia   . Hypothyroidism   . Memory loss   . Polymyalgia rheumatica ([place])   . Pulmonary embolism (H[facility])    hx of     Past Surgical History:  Procedure Laterality Date  . ACHILLES TENDON REPAIR  2004  . bilateral cataract surgery   2020  . CYSTOSCOPY W/ RETROGRADES Bilateral 01/21/2020   Procedure: CYSTOSCOPY WITH BILATERAL RETROGRADE;  Surgeon: Irine Seal, MD;  Location: [facility];  Service: Urology;  Laterality: Bilateral;  . IVC FILTER INSERTION      2000  . TRANSURETHRAL RESECTION OF BLADDER TUMOR WITH GYRUS (TURBT-GYRUS)     multiple times   . TRANSURETHRAL RESECTION OF BLADDER TUMOR WITH MITOMYCIN-C N/A 01/21/2020   Procedure: TRANSURETHRAL RESECTION OF BLADDER TUMOR WITH GEMCITABINE;  Surgeon: Irine Seal, MD;  Location: [facility];  Service: Urology;  Laterality: N/A;    There were no vitals filed for this visit.   Subjective Assessment - 07/22/20 1320    Subjective "I'm a great - I'm a great grandore. A grandfore." Pt did not acknowledge his error until SLP provided nonverbal  feedback (visual expression of confusion).    Currently in Pain? Yes    Pain Score 3     Pain Location Shoulder    Pain Orientation Left    Pain Descriptors / Indicators Sore    Pain Type Chronic pain                 ADULT SLP TREATMENT - 07/22/20 1324      General Information   Behavior/Cognition Alert;Cooperative;Pleasant mood;Requires cueing      Treatment Provided   Treatment provided Cognitive-Linquistic      Cognitive-Linquistic Treatment   Treatment focused on Aphasia;Cognition    Skilled Treatment Pt remarked today "I don't think I'm - - screwing - up as like as much as I was before." SLP having to repeat for pt auditory comprehenstion toda, rarely. Pt now using compensations of putting something away in the same place he found it, since initiation of ST - pt reports "misplaceing tings" is happening less than it did prior to ST. Pt used compensatory strategy of synonym (said "drugs" for "medicine" when could not articulate "medicine" correctly). Pt noticing errors today 80% of the time. Pt is doing a story history for his family wehre pt types an answer to a question about his life. SLP told pt to print out his response and bring it to next session. In a  structured task (written) pt req'd SLP mod cues occasionally (4/10) for word finding.      Assessment / Recommendations / Plan   Plan Continue with current plan of care      Progression Toward Goals   Progression toward goals Progressing toward goals              SLP Short Term Goals - 07/15/20 1327      SLP SHORT TERM GOAL #1   Title Pt will utilize compensations for aphasia 4/5 opportunities in  structured tasks with occasional min A over 2 sessions    Baseline 07-01-20    Status Partially Met      SLP SHORT TERM GOAL #2   Title Pt will use written cues for phone conversations, food orders, doctor's office with occasional min A over 2 sessions    Status Not Met      SLP SHORT TERM GOAL #3   Title Pt will  carryover 3 environmental compensations for aphasia with occasional min A.    Status Not Met      SLP SHORT TERM GOAL #4   Title Pt will carryover compensatory strategies to keep track of items and report loosing items 3x or less over 5 days    Status Deferred            SLP Long Term Goals - 07/22/20 1432      SLP LONG TERM GOAL #1   Title Pt will carryover compensations for aphasia during 15 minute moderately complex conversation with occasional min A    Time 3    Period Weeks    Status On-going      SLP LONG TERM GOAL #2   Title Pt will carryover compensations for memory when cooking, bill paying, managing medicaations with rare min A over 3 sessions    Time 3    Period Weeks    Status On-going      SLP LONG TERM GOAL #3   Title Pt will carryover 2 compensatory strategies to process and recall important/pertinent information from phone and in person conversations with occasional min A over 2 sessions    Time 3    Period Weeks    Status On-going            Plan - 07/22/20 1431    Clinical Impression Statement Gilbert Clark is referred for outpt ST due to difficulty with word finding and communication and mild memory issues. He reports today he has seen improved verbal expression recently, compared to previous to [place]. 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 His speech and language affect his ability to participate in these andn other activities and conversation in the community. I recommend cont'd skilled ST to maximize communication and cognition for safety, independence and QOL.    Speech Therapy Frequency 2x / week    Duration 8 weeks   17 visits   Treatment/Interventions Environmental controls;Language facilitation;SLP instruction and feedback;Compensatory strategies;Functional tasks;Cognitive reorganization;Compensatory techniques;Patient/family education;Multimodal communcation approach;Internal/external aids;Cueing hierarchy     Potential to Achieve Goals Good           Patient will benefit from skilled therapeutic intervention in order to improve the following deficits and impairments:   Aphasia  Cognitive communication deficit    Problem List There are no problems to display for this patient.   [facility] ,[place], [place]  07/22/2020, 2:33 PM  [facility] [address] [place], [place], [address]  Phone: [phone]   Fax:  [phone]   Name: Gilbert Clark MRN: [id number] Date of Birth: 1936-01-10

## 2020-07-27 ENCOUNTER — Ambulatory Visit: Payer: Medicare Other

## 2020-07-27 ENCOUNTER — Other Ambulatory Visit: Payer: Self-pay

## 2020-07-27 DIAGNOSIS — R41841 Cognitive communication deficit: Secondary | ICD-10-CM

## 2020-07-27 DIAGNOSIS — R4701 Aphasia: Secondary | ICD-10-CM

## 2020-07-27 NOTE — Therapy (Signed)
[facility] [address] [place], [place], [address] Phone: [phone]   Fax:  [phone]  Speech Language Pathology Treatment  Patient Details  Name: Gilbert Clark MRN: [id number] Date of Birth: April 02, 1936 Referring Provider (SLP): Dr. Suszanne Finch   Encounter Date: 07/27/2020   End of Session - 07/27/20 1657    Visit Number 12    Number of Visits 17    Date for SLP Re-Evaluation 10/18/20    SLP Start Time 1319    SLP Stop Time  1400    SLP Time Calculation (min) 41 min    Activity Tolerance Patient tolerated treatment well           Past Medical History:  Diagnosis Date  . Anemia   . Bladder cancer ([place])   . BPH (benign prostatic hyperplasia)   . Fibromyalgia   . GERD (gastroesophageal reflux disease)   . High risk medication use   . Hx of blood clots   . Hyperlipidemia   . Hypothyroidism   . Memory loss   . Polymyalgia rheumatica ([place])   . Pulmonary embolism ([facility])    hx of     Past Surgical History:  Procedure Laterality Date  . ACHILLES TENDON REPAIR  2004  . bilateral cataract surgery   2020  . CYSTOSCOPY W/ RETROGRADES Bilateral 01/21/2020   Procedure: CYSTOSCOPY WITH BILATERAL RETROGRADE;  Surgeon: Irine Seal, MD;  Location: [facility];  Service: Urology;  Laterality: Bilateral;  . IVC FILTER INSERTION      2000  . TRANSURETHRAL RESECTION OF BLADDER TUMOR WITH GYRUS (TURBT-GYRUS)     multiple times   . TRANSURETHRAL RESECTION OF BLADDER TUMOR WITH MITOMYCIN-C N/A 01/21/2020   Procedure: TRANSURETHRAL RESECTION OF BLADDER TUMOR WITH GEMCITABINE;  Surgeon: Irine Seal, MD;  Location: [facility];  Service: Urology;  Laterality: N/A;    There were no vitals filed for this visit.   Subjective Assessment - 07/27/20 1324    Subjective "Some losing - - some gaining it back." (pt, re: his talking)    Currently in Pain? Yes    Pain Score 2     Pain  Location Shoulder    Pain Orientation Left    Pain Descriptors / Indicators Sore    Pain Type Chronic pain    Pain Onset More than a month ago    Pain Frequency Constant                 ADULT SLP TREATMENT - 07/27/20 1327      General Information   Behavior/Cognition Alert;Cooperative;Pleasant mood;Requires cueing      Treatment Provided   Treatment provided Cognitive-Linquistic      Cognitive-Linquistic Treatment   Treatment focused on Cognition;Aphasia    Skilled Treatment "Sometimes I found that I just realx and  - - bingo! (the word will) come back." SLP  assisted pt today with his written language. SLP req'd to provide total A in 40% of pt's errors in written langauge (response to prompts for writing his history). "I guess I should - maybe I should give these to my daugher to - she should look at it." SLP suggested pt have pt's daughter look over his responses so far instead of giving them all to her at the end of this 52 week endeavor.      Assessment / Recommendations / Plan   Plan Continue with current plan of care      Progression Toward  Goals   Progression toward goals Progressing toward goals            SLP Education - 07/27/20 1655    Education Details pt should send some of his written responses to daughter when her work settles down    [employer]) Educated Patient    Methods Explanation    Comprehension Verbalized understanding            SLP Short Term Goals - 07/15/20 1327      SLP SHORT TERM GOAL #1   Title Pt will utilize compensations for aphasia 4/5 opportunities in  structured tasks with occasional min A over 2 sessions    Baseline 07-01-20    Status Partially Met      SLP SHORT TERM GOAL #2   Title Pt will use written cues for phone conversations, food orders, doctor's office with occasional min A over 2 sessions    Status Not Met      SLP SHORT TERM GOAL #3   Title Pt will carryover 3 environmental compensations for aphasia with  occasional min A.    Status Not Met      SLP SHORT TERM GOAL #4   Title Pt will carryover compensatory strategies to keep track of items and report loosing items 3x or less over 5 days    Status Deferred            SLP Long Term Goals - 07/27/20 1659      SLP LONG TERM GOAL #1   Title Pt will carryover compensations for aphasia during 15 minute moderately complex conversation with occasional min A    Time 2    Period Weeks    Status On-going      SLP LONG TERM GOAL #2   Title Pt will carryover compensations for memory when cooking, bill paying, managing medicaations with rare min A over 3 sessions    Time 2    Period Weeks    Status On-going      SLP LONG TERM GOAL #3   Title Pt will carryover 2 compensatory strategies to process and recall important/pertinent information from phone and in person conversations with occasional min A over 2 sessions    Time 2    Period Weeks    Status On-going            Plan - 07/27/20 1657    Clinical Impression Statement Gilbert Clark continues to have difficulty with word finding and communication and mild memory issues. He continues to report today he has seen improved verbal expression recently, compared to previous to [place]. 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 His speech and language affect his ability to participate in these and other activities and conversation in the community. I recommend cont'd skilled ST to maximize communication and cognition for safety, independence and QOL.    Speech Therapy Frequency 2x / week    Duration 8 weeks   17 visits   Treatment/Interventions Environmental controls;Language facilitation;SLP instruction and feedback;Compensatory strategies;Functional tasks;Cognitive reorganization;Compensatory techniques;Patient/family education;Multimodal communcation approach;Internal/external aids;Cueing hierarchy    Potential to Achieve Goals Good           Patient  will benefit from skilled therapeutic intervention in order to improve the following deficits and impairments:   Aphasia  Cognitive communication deficit    Problem List There are no problems to display for this patient.   [facility] ,[place], [place]  07/27/2020, 5:00 PM  [facility]  [place], [place], [address] Phone: [phone]   Fax:  [phone]   Name: Gilbert Clark MRN: [id number] Date of Birth: 1935/10/12

## 2020-07-29 ENCOUNTER — Other Ambulatory Visit: Payer: Self-pay

## 2020-07-29 ENCOUNTER — Ambulatory Visit: Payer: Medicare Other

## 2020-07-29 DIAGNOSIS — R4701 Aphasia: Secondary | ICD-10-CM

## 2020-07-29 DIAGNOSIS — R41841 Cognitive communication deficit: Secondary | ICD-10-CM

## 2020-07-29 NOTE — Therapy (Signed)
[facility] [address] [place], [place], [address] Phone: [phone]   Fax:  [phone]  Speech Language Pathology Treatment  Patient Details  Name: Gilbert Clark MRN: [id number] Date of Birth: 1935/05/12 Referring Provider (SLP): Dr. Suszanne Finch   Encounter Date: 07/29/2020   End of Session - 07/29/20 1424    Visit Number 13    Number of Visits 17    Date for SLP Re-Evaluation 10/18/20    SLP Start Time 79    SLP Stop Time  1400    SLP Time Calculation (min) 42 min    Activity Tolerance Patient tolerated treatment well           Past Medical History:  Diagnosis Date  . Anemia   . Bladder cancer ([place])   . BPH (benign prostatic hyperplasia)   . Fibromyalgia   . GERD (gastroesophageal reflux disease)   . High risk medication use   . Hx of blood clots   . Hyperlipidemia   . Hypothyroidism   . Memory loss   . Polymyalgia rheumatica ([place])   . Pulmonary embolism ([facility])    hx of     Past Surgical History:  Procedure Laterality Date  . ACHILLES TENDON REPAIR  2004  . bilateral cataract surgery   2020  . CYSTOSCOPY W/ RETROGRADES Bilateral 01/21/2020   Procedure: CYSTOSCOPY WITH BILATERAL RETROGRADE;  Surgeon: Irine Seal, MD;  Location: [facility];  Service: Urology;  Laterality: Bilateral;  . IVC FILTER INSERTION      2000  . TRANSURETHRAL RESECTION OF BLADDER TUMOR WITH GYRUS (TURBT-GYRUS)     multiple times   . TRANSURETHRAL RESECTION OF BLADDER TUMOR WITH MITOMYCIN-C N/A 01/21/2020   Procedure: TRANSURETHRAL RESECTION OF BLADDER TUMOR WITH GEMCITABINE;  Surgeon: Irine Seal, MD;  Location: [facility];  Service: Urology;  Laterality: N/A;    There were no vitals filed for this visit.          ADULT SLP TREATMENT - 07/29/20 1340      General Information   Behavior/Cognition Alert;Cooperative;Pleasant mood;Requires cueing      Treatment Provided    Treatment provided Cognitive-Linquistic      Cognitive-Linquistic Treatment   Treatment focused on Aphasia;Cognition    Skilled Treatment Today SLP having more success with SLP in session with conveying his message verbally, and pt conveyed to SLP he continues to think he is having less difficulty with verbal expression. After SLP suggestion previous session about using target word, which pt had anomia, in 2-3 sentences, pt stated he seems is retaining the word more often. Pt used his aphasia card this week.In 15 minutes min-mod complex, and 15 minutes mod complex conversation today, pt was functional using compensations.      Assessment / Recommendations / Plan   Plan Continue with current plan of care      Progression Toward Goals   Progression toward goals Progressing toward goals              SLP Short Term Goals - 07/15/20 1327      SLP SHORT TERM GOAL #1   Title Pt will utilize compensations for aphasia 4/5 opportunities in  structured tasks with occasional min A over 2 sessions    Baseline 07-01-20    Status Partially Met      SLP SHORT TERM GOAL #2   Title Pt will use written cues for phone conversations, food orders, doctor's office with occasional min A  over 2 sessions    Status Not Met      SLP SHORT TERM GOAL #3   Title Pt will carryover 3 environmental compensations for aphasia with occasional min A.    Status Not Met      SLP SHORT TERM GOAL #4   Title Pt will carryover compensatory strategies to keep track of items and report loosing items 3x or less over 5 days    Status Deferred            SLP Long Term Goals - 07/29/20 1425      SLP LONG TERM GOAL #1   Title Pt will carryover compensations for aphasia during 15 minute moderately complex conversation with occasional min A    Baseline 07-29-20    Time 2    Period Weeks    Status On-going      SLP LONG TERM GOAL #2   Title Pt will carryover compensations for memory when cooking, bill paying, managing  medicaations with rare min A over 3 sessions    Time 2    Period Weeks    Status On-going      SLP LONG TERM GOAL #3   Title Pt will carryover 2 compensatory strategies to process and recall important/pertinent information from phone and in person conversations with occasional min A over 2 sessions    Time 2    Period Weeks    Status On-going            Plan - 07/29/20 1424    Clinical Impression Statement [name] continues to have difficulty with word finding and communication and mild memory issues. He continues to report today he has seen improved verbal expression recently, compared to previous to [place]. 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 His speech and language affect his ability to participate in these and other activities and conversation in the community. I recommend cont'd skilled ST to maximize communication and cognition for safety, independence and QOL.    Speech Therapy Frequency 2x / week    Duration 8 weeks   17 visits   Treatment/Interventions Environmental controls;Language facilitation;SLP instruction and feedback;Compensatory strategies;Functional tasks;Cognitive reorganization;Compensatory techniques;Patient/family education;Multimodal communcation approach;Internal/external aids;Cueing hierarchy    Potential to Achieve Goals Good           Patient will benefit from skilled therapeutic intervention in order to improve the following deficits and impairments:   Aphasia  Cognitive communication deficit    Problem List There are no problems to display for this patient.   [facility] ,[place], [place]  07/29/2020, 2:28 PM  [facility] [address] [place], [place], [address] Phone: [phone]   Fax:  [phone]   Name: Gilbert Clark MRN: [id number] Date of Birth: December 15, 1935

## 2020-07-30 DIAGNOSIS — G8929 Other chronic pain: Secondary | ICD-10-CM | POA: Diagnosis not present

## 2020-07-30 DIAGNOSIS — E782 Mixed hyperlipidemia: Secondary | ICD-10-CM | POA: Diagnosis not present

## 2020-07-30 DIAGNOSIS — E039 Hypothyroidism, unspecified: Secondary | ICD-10-CM | POA: Diagnosis not present

## 2020-08-03 ENCOUNTER — Other Ambulatory Visit: Payer: Self-pay

## 2020-08-03 ENCOUNTER — Ambulatory Visit: Payer: Medicare Other

## 2020-08-03 DIAGNOSIS — R4701 Aphasia: Secondary | ICD-10-CM | POA: Diagnosis not present

## 2020-08-03 DIAGNOSIS — R41841 Cognitive communication deficit: Secondary | ICD-10-CM

## 2020-08-03 NOTE — Therapy (Signed)
[facility] [address] [place], [place], [address] Phone: [phone]   Fax:  [phone]  Speech Language Pathology Treatment  Patient Details  Name: Gilbert Clark MRN: [id number] Date of Birth: 28-Jun-1935 Referring Provider (SLP): Dr. Suszanne Finch   Encounter Date: 08/03/2020   End of Session - 08/03/20 1337    Visit Number 14    Number of Visits 17    Date for SLP Re-Evaluation 10/18/20    SLP Start Time 7412    SLP Stop Time  1400    SLP Time Calculation (min) 43 min    Activity Tolerance Patient tolerated treatment well           Past Medical History:  Diagnosis Date  . Anemia   . Bladder cancer ([facility])   . BPH (benign prostatic hyperplasia)   . Fibromyalgia   . GERD (gastroesophageal reflux disease)   . High risk medication use   . Hx of blood clots   . Hyperlipidemia   . Hypothyroidism   . Memory loss   . Polymyalgia rheumatica ([place])   . Pulmonary embolism ([facility])    hx of     Past Surgical History:  Procedure Laterality Date  . ACHILLES TENDON REPAIR  2004  . bilateral cataract surgery   2020  . CYSTOSCOPY W/ RETROGRADES Bilateral 01/21/2020   Procedure: CYSTOSCOPY WITH BILATERAL RETROGRADE;  Surgeon: Irine Seal, MD;  Location: [facility];  Service: Urology;  Laterality: Bilateral;  . IVC FILTER INSERTION      2000  . TRANSURETHRAL RESECTION OF BLADDER TUMOR WITH GYRUS (TURBT-GYRUS)     multiple times   . TRANSURETHRAL RESECTION OF BLADDER TUMOR WITH MITOMYCIN-C N/A 01/21/2020   Procedure: TRANSURETHRAL RESECTION OF BLADDER TUMOR WITH GEMCITABINE;  Surgeon: Irine Seal, MD;  Location: [facility];  Service: Urology;  Laterality: N/A;    There were no vitals filed for this visit.   Subjective Assessment - 08/03/20 1323    Subjective "One daughter does my taxes - and I - so I - I had to get that in." "It was an intre - intregus evening Saturday  night."    Currently in Pain? Yes    Pain Score 2     Pain Location Shoulder    Pain Orientation Left    Pain Descriptors / Indicators Sore    Pain Type Chronic pain    Pain Onset More than a month ago                 ADULT SLP TREATMENT - 08/03/20 1325      General Information   Behavior/Cognition Alert;Cooperative;Pleasant mood;Requires cueing      Treatment Provided   Treatment provided Cognitive-Linquistic      Cognitive-Linquistic Treatment   Treatment focused on Aphasia;Cognition    Skilled Treatment Pt conveys that he things he is doing better with language than prior to ST. "I'm not as concerned about - about it. I know I'm - I'm not going to - maybe I'll be 80% or 70% or whatever." Pt engaged with SLP talking about his memory and related deficits in an 20 minuteconversation with pt taking most of the conversational burden- which was successful with compensations, and 4 phonemic errors which did not inhibit pt's message. Pt's "s" statement was included in the second conversation pt initiated today - 15 minutes in length and  more even conversational responsibilty between SLP and [place]. Gilbert Clark voiced he understands his speech  might not be 100% of where it was, but he has experienced more success after initiation of ST and doing the tasks suggested of him. He knows  he will return if he has a reduction in his language status. Pt agreed that next session will be appropriate for d/c.      Assessment / Recommendations / Plan   Plan Continue with current plan of care   d/c likely next session     Progression Toward Goals   Progression toward goals Progressing toward goals              SLP Short Term Goals - 07/15/20 1327      SLP SHORT TERM GOAL #1   Title Pt will utilize compensations for aphasia 4/5 opportunities in  structured tasks with occasional min A over 2 sessions    Baseline 07-01-20    Status Partially Met      SLP SHORT TERM GOAL #2   Title Pt will use  written cues for phone conversations, food orders, doctor's office with occasional min A over 2 sessions    Status Not Met      SLP SHORT TERM GOAL #3   Title Pt will carryover 3 environmental compensations for aphasia with occasional min A.    Status Not Met      SLP SHORT TERM GOAL #4   Title Pt will carryover compensatory strategies to keep track of items and report loosing items 3x or less over 5 days    Status Deferred            SLP Long Term Goals - 08/03/20 1339      SLP LONG TERM GOAL #1   Title Pt will carryover compensations for aphasia during 15 minute moderately complex conversation with occasional min A    Baseline 07-29-20, 08/03/20    Status Achieved      SLP LONG TERM GOAL #2   Title Pt will carryover compensations for memory when cooking, Gilbert Clark paying, managing medicaations with rare min A over 3 sessions    Time 1    Period Weeks    Status On-going      SLP LONG TERM GOAL #3   Title Pt will carryover 2 compensatory strategies to process and recall important/pertinent information from phone and in person conversations with occasional min A over 2 sessions    Time 1    Period Weeks    Status On-going            Plan - 08/03/20 1651    Clinical Impression Statement [name] continues to have improvement with his difficulty with word finding and communication and mild memory issues. He agrees that d/c next session is appropriate and conveyed to SLP today that he is thankful for SLP's assistance in recovreing some of his verbal expression skills. 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 His speech and language affect his ability to participate in these and other activities and conversation in the community. I recommend cont'd skilled ST one more session to maximize communication and cognition for safety, independence and QOL.    Speech Therapy Frequency 2x / week    Duration 8 weeks   17 visits    Treatment/Interventions Environmental controls;Language facilitation;SLP instruction and feedback;Compensatory strategies;Functional tasks;Cognitive reorganization;Compensatory techniques;Patient/family education;Multimodal communcation approach;Internal/external aids;Cueing hierarchy    Potential to Achieve Goals Good           Patient will benefit from skilled therapeutic intervention in  order to improve the following deficits and impairments:   Aphasia  Cognitive communication deficit    Problem List There are no problems to display for this patient.   [facility] ,[place], [place]  08/03/2020, 4:55 PM  [facility] [address] [place], [place], [address] Phone: [phone]   Fax:  [phone]   Name: Gilbert Clark MRN: [id number] Date of Birth: 1935/06/05

## 2020-08-05 ENCOUNTER — Ambulatory Visit: Payer: Medicare Other

## 2020-08-10 ENCOUNTER — Ambulatory Visit: Payer: Medicare Other

## 2020-08-12 ENCOUNTER — Ambulatory Visit: Payer: Medicare Other

## 2020-08-12 ENCOUNTER — Other Ambulatory Visit: Payer: Self-pay

## 2020-08-12 DIAGNOSIS — R41841 Cognitive communication deficit: Secondary | ICD-10-CM

## 2020-08-12 DIAGNOSIS — R4701 Aphasia: Secondary | ICD-10-CM | POA: Diagnosis not present

## 2020-08-12 NOTE — Therapy (Signed)
[facility] [address] [place], [place], [address] Phone: [phone]   Fax:  [phone]  Speech Language Pathology Treatment/Discharge Summary  Patient Details  Name: Gilbert Clark MRN: [id number] Date of Birth: Oct 19, 1935 Referring Provider (SLP): Dr. Suszanne Finch   Encounter Date: 08/12/2020   End of Session - 08/12/20 1703    Visit Number 15    Number of Visits 17    Date for SLP Re-Evaluation 10/18/20    SLP Start Time 1320    SLP Stop Time  1400    SLP Time Calculation (min) 40 min    Activity Tolerance Patient tolerated treatment well           Past Medical History:  Diagnosis Date  . Anemia   . Bladder cancer ([place])   . BPH (benign prostatic hyperplasia)   . Fibromyalgia   . GERD (gastroesophageal reflux disease)   . High risk medication use   . Hx of blood clots   . Hyperlipidemia   . Hypothyroidism   . Memory loss   . Polymyalgia rheumatica ([place])   . Pulmonary embolism ([facility])    hx of     Past Surgical History:  Procedure Laterality Date  . ACHILLES TENDON REPAIR  2004  . bilateral cataract surgery   2020  . CYSTOSCOPY W/ RETROGRADES Bilateral 01/21/2020   Procedure: CYSTOSCOPY WITH BILATERAL RETROGRADE;  Surgeon: Irine Seal, MD;  Location: [facility];  Service: Urology;  Laterality: Bilateral;  . IVC FILTER INSERTION      2000  . TRANSURETHRAL RESECTION OF BLADDER TUMOR WITH GYRUS (TURBT-GYRUS)     multiple times   . TRANSURETHRAL RESECTION OF BLADDER TUMOR WITH MITOMYCIN-C N/A 01/21/2020   Procedure: TRANSURETHRAL RESECTION OF BLADDER TUMOR WITH GEMCITABINE;  Surgeon: Irine Seal, MD;  Location: [facility];  Service: Urology;  Laterality: N/A;     SPEECH THERAPY DISCHARGE SUMMARY  Visits from Start of Care: 15  Current functional level related to goals / functional outcomes: See goals below. Goals for memory were addressed only informally  due to pt desire to focus on speech/language skills.   Remaining deficits: Mild cognitive/memory deficit, expressive and receptive language disorder/deficit   Education / Equipment: Compensations for anomia/dysnomia, ways to cont to maintain speech/language skills   Plan: Patient agrees to discharge.  Patient goals were partially met. Patient is being discharged due to being pleased with the current functional level.  ?????Pt may return at any time in the future to "tweak" some aspects of his program. He acknowledges this as well.         There were no vitals filed for this visit.   Subjective Assessment - 08/12/20 1657    Subjective Pt arrives with Gilbert Clark, daughter.    Currently in Pain? Yes    Pain Score 2     Pain Location Shoulder    Pain Orientation Left    Pain Descriptors / Indicators Sore    Pain Type Chronic pain    Pain Onset More than a month ago                 ADULT SLP TREATMENT - 08/12/20 1658      General Information   Behavior/Cognition Alert;Cooperative;Pleasant mood;Requires cueing      Treatment Provided   Treatment provided Cognitive-Linquistic      Cognitive-Linquistic Treatment   Treatment focused on Aphasia;Cognition    Skilled Treatment Pt tells SLP that he cont to think  his speech is better than at evaluation. SLP reviewed with pt how to use his communication book including words for stores, restaurants, friends, family, co-workers. Pt explained mod complex concepts today with functional means using compensatory techniques of synonym and circumlocution. Daughter asked questions about how to maintain function and SLP encouarged Gilbert Clark to talk to his family on a daily basis, eliminate/reduce anxiety about talking by stopping and taking deep focusing breath and move on if necessary, practice names and other items pt frequently names, among other things.      Assessment / Recommendations / Plan   Plan Discharge SLP treatment due to (comment)   pt  satisfied with current level     Progression Toward Goals   Progression toward goals --   d/c day- see goals           SLP Education - 08/12/20 1702    Education Details things to do to cont to maintain speech-language function    Person(s) Educated Patient;Child(ren)    Methods Explanation    Comprehension Verbalized understanding            SLP Short Term Goals - 07/15/20 1327      SLP SHORT TERM GOAL #1   Title Pt will utilize compensations for aphasia 4/5 opportunities in  structured tasks with occasional min A over 2 sessions    Baseline 07-01-20    Status Partially Met      SLP SHORT TERM GOAL #2   Title Pt will use written cues for phone conversations, food orders, doctor's office with occasional min A over 2 sessions    Status Not Met      SLP SHORT TERM GOAL #3   Title Pt will carryover 3 environmental compensations for aphasia with occasional min A.    Status Not Met      SLP SHORT TERM GOAL #4   Title Pt will carryover compensatory strategies to keep track of items and report loosing items 3x or less over 5 days    Status Deferred            SLP Long Term Goals - 08/12/20 1704      SLP LONG TERM GOAL #1   Title Pt will carryover compensations for aphasia during 15 minute moderately complex conversation with occasional min A    Baseline 07-29-20, 08/03/20    Status Achieved      SLP LONG TERM GOAL #2   Title Pt will carryover compensations for memory when cooking, Gilbert Clark paying, managing medicaations with rare min A over 3 sessions    Status Deferred      SLP LONG TERM GOAL #3   Title Pt will carryover 2 compensatory strategies to process and recall important/pertinent information from phone and in person conversations with occasional min A over 2 sessions    Baseline 08-12-20    Status Partially Met            Plan - 08/12/20 1703    Clinical Impression Statement Gilbert Clark continues to have improvement with his difficulty with  word finding and communication and mild memory issues. He agrees that d/c today is appropriate and conveyed to SLP today that he is thankful for SLP's assistance in recovreing some of his verbal expression skills. 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    Duration --   17 visits   Treatment/Interventions Environmental controls;Language facilitation;SLP  instruction and feedback;Compensatory strategies;Functional tasks;Cognitive reorganization;Compensatory techniques;Patient/family education;Multimodal communcation approach;Internal/external aids;Cueing hierarchy    Potential to Achieve Goals Good           Patient will benefit from skilled therapeutic intervention in order to improve the following deficits and impairments:   Aphasia  Cognitive communication deficit    Problem List There are no problems to display for this patient.   [facility] ,[place], [place]  08/12/2020, 5:05 PM  [facility] [address] [place], [place], [address] Phone: [phone]   Fax:  [phone]   Name: Gilbert Clark MRN: [id number] Date of Birth: 1935-11-20

## 2020-08-20 DIAGNOSIS — M353 Polymyalgia rheumatica: Secondary | ICD-10-CM | POA: Diagnosis not present

## 2020-08-20 DIAGNOSIS — Z7952 Long term (current) use of systemic steroids: Secondary | ICD-10-CM | POA: Diagnosis not present

## 2020-10-05 DIAGNOSIS — E039 Hypothyroidism, unspecified: Secondary | ICD-10-CM | POA: Diagnosis not present

## 2020-10-05 DIAGNOSIS — E782 Mixed hyperlipidemia: Secondary | ICD-10-CM | POA: Diagnosis not present

## 2020-10-05 DIAGNOSIS — G8929 Other chronic pain: Secondary | ICD-10-CM | POA: Diagnosis not present

## 2020-10-26 DIAGNOSIS — R7301 Impaired fasting glucose: Secondary | ICD-10-CM | POA: Diagnosis not present

## 2020-10-26 DIAGNOSIS — Z1389 Encounter for screening for other disorder: Secondary | ICD-10-CM | POA: Diagnosis not present

## 2020-10-26 DIAGNOSIS — M25561 Pain in right knee: Secondary | ICD-10-CM | POA: Diagnosis not present

## 2020-10-26 DIAGNOSIS — E782 Mixed hyperlipidemia: Secondary | ICD-10-CM | POA: Diagnosis not present

## 2020-10-26 DIAGNOSIS — Z86010 Personal history of colonic polyps: Secondary | ICD-10-CM | POA: Diagnosis not present

## 2020-10-26 DIAGNOSIS — E039 Hypothyroidism, unspecified: Secondary | ICD-10-CM | POA: Diagnosis not present

## 2020-10-26 DIAGNOSIS — M353 Polymyalgia rheumatica: Secondary | ICD-10-CM | POA: Diagnosis not present

## 2020-10-26 DIAGNOSIS — Z79899 Other long term (current) drug therapy: Secondary | ICD-10-CM | POA: Diagnosis not present

## 2020-10-26 DIAGNOSIS — Z86711 Personal history of pulmonary embolism: Secondary | ICD-10-CM | POA: Diagnosis not present

## 2020-10-26 DIAGNOSIS — M25562 Pain in left knee: Secondary | ICD-10-CM | POA: Diagnosis not present

## 2020-10-26 DIAGNOSIS — Z0000 Encounter for general adult medical examination without abnormal findings: Secondary | ICD-10-CM | POA: Diagnosis not present

## 2020-11-16 DIAGNOSIS — M353 Polymyalgia rheumatica: Secondary | ICD-10-CM | POA: Diagnosis not present

## 2020-11-16 DIAGNOSIS — R4701 Aphasia: Secondary | ICD-10-CM | POA: Diagnosis not present

## 2020-11-16 DIAGNOSIS — E039 Hypothyroidism, unspecified: Secondary | ICD-10-CM | POA: Diagnosis not present

## 2020-11-16 DIAGNOSIS — M25561 Pain in right knee: Secondary | ICD-10-CM | POA: Diagnosis not present

## 2020-11-16 DIAGNOSIS — Z86711 Personal history of pulmonary embolism: Secondary | ICD-10-CM | POA: Diagnosis not present

## 2020-11-16 DIAGNOSIS — M25562 Pain in left knee: Secondary | ICD-10-CM | POA: Diagnosis not present

## 2020-11-16 DIAGNOSIS — C679 Malignant neoplasm of bladder, unspecified: Secondary | ICD-10-CM | POA: Diagnosis not present

## 2020-11-16 DIAGNOSIS — E782 Mixed hyperlipidemia: Secondary | ICD-10-CM | POA: Diagnosis not present

## 2020-12-04 DIAGNOSIS — E039 Hypothyroidism, unspecified: Secondary | ICD-10-CM | POA: Diagnosis not present

## 2020-12-04 DIAGNOSIS — G8929 Other chronic pain: Secondary | ICD-10-CM | POA: Diagnosis not present

## 2020-12-04 DIAGNOSIS — E782 Mixed hyperlipidemia: Secondary | ICD-10-CM | POA: Diagnosis not present

## 2020-12-09 DIAGNOSIS — R3914 Feeling of incomplete bladder emptying: Secondary | ICD-10-CM | POA: Diagnosis not present

## 2020-12-09 DIAGNOSIS — Z8551 Personal history of malignant neoplasm of bladder: Secondary | ICD-10-CM | POA: Diagnosis not present

## 2020-12-22 DIAGNOSIS — M353 Polymyalgia rheumatica: Secondary | ICD-10-CM | POA: Diagnosis not present

## 2020-12-22 DIAGNOSIS — Z7952 Long term (current) use of systemic steroids: Secondary | ICD-10-CM | POA: Diagnosis not present

## 2021-04-21 DIAGNOSIS — S93401A Sprain of unspecified ligament of right ankle, initial encounter: Secondary | ICD-10-CM | POA: Diagnosis not present

## 2021-04-21 DIAGNOSIS — M25571 Pain in right ankle and joints of right foot: Secondary | ICD-10-CM | POA: Diagnosis not present

## 2021-04-21 DIAGNOSIS — M79671 Pain in right foot: Secondary | ICD-10-CM | POA: Diagnosis not present

## 2021-05-10 DIAGNOSIS — Z961 Presence of intraocular lens: Secondary | ICD-10-CM | POA: Diagnosis not present

## 2021-05-17 DIAGNOSIS — R4701 Aphasia: Secondary | ICD-10-CM | POA: Diagnosis not present

## 2021-05-17 DIAGNOSIS — M25562 Pain in left knee: Secondary | ICD-10-CM | POA: Diagnosis not present

## 2021-05-17 DIAGNOSIS — C679 Malignant neoplasm of bladder, unspecified: Secondary | ICD-10-CM | POA: Diagnosis not present

## 2021-05-17 DIAGNOSIS — Z86711 Personal history of pulmonary embolism: Secondary | ICD-10-CM | POA: Diagnosis not present

## 2021-05-17 DIAGNOSIS — E039 Hypothyroidism, unspecified: Secondary | ICD-10-CM | POA: Diagnosis not present

## 2021-05-20 DIAGNOSIS — M353 Polymyalgia rheumatica: Secondary | ICD-10-CM | POA: Diagnosis not present

## 2021-05-20 DIAGNOSIS — Z79899 Other long term (current) drug therapy: Secondary | ICD-10-CM | POA: Diagnosis not present

## 2021-05-20 DIAGNOSIS — Z86711 Personal history of pulmonary embolism: Secondary | ICD-10-CM | POA: Diagnosis not present

## 2021-05-20 DIAGNOSIS — R519 Headache, unspecified: Secondary | ICD-10-CM | POA: Diagnosis not present

## 2021-05-20 DIAGNOSIS — G8929 Other chronic pain: Secondary | ICD-10-CM | POA: Diagnosis not present

## 2021-05-20 DIAGNOSIS — E039 Hypothyroidism, unspecified: Secondary | ICD-10-CM | POA: Diagnosis not present

## 2021-05-20 DIAGNOSIS — R7301 Impaired fasting glucose: Secondary | ICD-10-CM | POA: Diagnosis not present

## 2021-05-20 DIAGNOSIS — Z23 Encounter for immunization: Secondary | ICD-10-CM | POA: Diagnosis not present

## 2021-05-20 DIAGNOSIS — E782 Mixed hyperlipidemia: Secondary | ICD-10-CM | POA: Diagnosis not present

## 2021-06-09 DIAGNOSIS — C672 Malignant neoplasm of lateral wall of bladder: Secondary | ICD-10-CM | POA: Diagnosis not present

## 2021-06-09 DIAGNOSIS — R350 Frequency of micturition: Secondary | ICD-10-CM | POA: Diagnosis not present

## 2021-06-11 ENCOUNTER — Other Ambulatory Visit: Payer: Self-pay | Admitting: Urology

## 2021-06-14 NOTE — Progress Notes (Addendum)
COVID swab appointment: N/A ? ?COVID Vaccine Completed:  Yes x2 ?Date COVID Vaccine completed: ?Has received booster:  Yes x4 ?COVID vaccine manufacturer: Pfizer    ? ?Date of COVID positive in last 90 days: N/A ? ?PCP - Cari Caraway, MD ?Cardiologist - N/A ? ?Chest x-ray - N/A ?EKG - N/A ?Stress Test - greater than 5 years ago ?ECHO - greater than 2 years Epic ?Cardiac Cath -  ?Pacemaker/ICD device last checked: ?Spinal Cord Stimulator: ? ?Bowel Prep - N/A ? ?Sleep Study - N/A ?CPAP -  ? ?Fasting Blood Sugar - N/A ?Checks Blood Sugar _____ times a day ? ?Blood Thinner Instructions: Eliquis.   Patient will check with Dr. Addison Lank to see when he needs to stop.  He said he usually stops it two days before procedures ? ?Aspirin Instructions: N/A ?Last Dose: ? ?Activity level:   Can go up a flight of stairs and perform activities of daily living without stopping and without symptoms of chest pain or shortness of breath. Patient lives alone  ? ?Anesthesia review:  IVC filter and Eliquis for blood clots.  Patient has aphasia which affects his memory ? ?Patient denies shortness of breath, fever, cough and chest pain at PAT appointment ? ?Patient verbalized understanding of instructions that were given to them at the PAT appointment. Patient was also instructed that they will need to review over the PAT instructions again at home before surgery.  ?

## 2021-06-14 NOTE — Patient Instructions (Addendum)
DUE TO COVID-19 ONLY ONE VISITOR IS ALLOWED TO COME WITH YOU AND STAY IN THE WAITING ROOM ONLY DURING PRE OP AND PROCEDURE.   ?**NO VISITORS ARE ALLOWED IN THE SHORT STAY AREA OR RECOVERY ROOM!!** ? ? ?You are not required to quarantine, however you are required to wear a well-fitted mask when you are out and around people not in your household.  ?Hand Hygiene often ?Do NOT share personal items ?Notify your provider if you are in close contact with someone who has COVID or you develop fever 100.4 or greater, new onset of sneezing, cough, sore throat, shortness of breath or body aches. ?     ? Your procedure is scheduled on: Friday, 06-25-21 ? ? Report to [facility] Main Entrance ? ?  Report to admitting at 6:45 AM ? ? Call this number if you have problems the morning of surgery [phone] ? ? Do not eat food or drink liquids :After Midnight. ?   ?Oral Hygiene is also important to reduce your risk of infection.                                    ?Remember - BRUSH YOUR TEETH THE MORNING OF SURGERY WITH YOUR REGULAR TOOTHPASTE ? ? Do NOT smoke after Midnight ? ?Take these medicines the morning of surgery with A SIP OF WATER: Levothyroxine, Tamsulosin, Finasteride, Tylenol ? ? Eliquis - Check with Dr. Addison Lank to see when to stop ? ? Stop all vitamins and herbal supplements a week before surgery.   ? ? Stop Motrin, Aleve, Ibuprofen a week before surgery. ?                  ?           You may not have any metal on your body including jewelry, and body piercing ? ?           Do not wear  lotions, powders,  cologne, or deodorant ? ?            Men may shave face and neck.  ? ? Contacts, dentures or bridgework may not be worn into surgery. ? ? Do not bring valuables to the hospital. [place]. ?  ?Patients discharged on the day of surgery will not be allowed to drive home.  Someone NEEDS to stay with you for the first 24 hours after anesthesia. ? ?Please read over the  following fact sheets you were given: IF [facility] ? ? [facility] - Preparing for Surgery ?Before surgery, you can play an important role.  Because skin is not sterile, your skin needs to be as free of germs as possible.  You can reduce the number of germs on your skin by washing with CHG (chlorahexidine gluconate) soap before surgery.  CHG is an antiseptic cleaner which kills germs and bonds with the skin to continue killing germs even after washing. ?Please DO NOT use if you have an allergy to CHG or antibacterial soaps.  If your skin becomes reddened/irritated stop using the CHG and inform your nurse when you arrive at Short Stay. ?Do not shave (including legs and underarms) for at least 48 hours prior to the first CHG shower.  You may shave your face/neck. ? ?Please follow these instructions carefully: ? 1.  Shower with  CHG Soap the night before surgery and the  morning of surgery. ? 2.  If you choose to wash your hair, wash your hair first as usual with your normal  shampoo. ? 3.  After you shampoo, rinse your hair and body thoroughly to remove the shampoo.                            ? 4.  Use CHG as you would any other liquid soap.  You can apply chg directly to the skin and wash.  Gently with a scrungie or clean washcloth. ? 5.  Apply the CHG Soap to your body ONLY FROM THE NECK DOWN.   Do   not use on face/ open      ?                     Wound or open sores. Avoid contact with eyes, ears mouth and   genitals (private parts).  ?                     Production manager,  Genitals (private parts) with your normal soap. ?            6.  Wash thoroughly, paying special attention to the area where your    surgery  will be performed. ? 7.  Thoroughly rinse your body with warm water from the neck down. ? 8.  DO NOT shower/wash with your normal soap after using and rinsing off the CHG Soap. ?               9.  Pat yourself dry with a clean towel. ?            10.  Wear clean pajamas. ?           11.  Place clean sheets on your bed the night of your first shower and do not  sleep with pets. ?Day of Surgery : ?Do not apply any lotions/deodorants the morning of surgery.  Please wear clean clothes to the hospital/surgery center. ? ?FAILURE TO FOLLOW THESE INSTRUCTIONS MAY RESULT IN THE CANCELLATION OF YOUR SURGERY ? ?PATIENT SIGNATURE_________________________________ ? ?NURSE SIGNATURE__________________________________ ? ?________________________________________________________________________  ?

## 2021-06-16 ENCOUNTER — Other Ambulatory Visit: Payer: Self-pay

## 2021-06-16 ENCOUNTER — Encounter (HOSPITAL_COMMUNITY)
Admission: RE | Admit: 2021-06-16 | Discharge: 2021-06-16 | Disposition: A | Discharge status: Home / Self Care | Payer: Medicare Other | Source: Ambulatory Visit | Attending: Urology | Admitting: Urology

## 2021-06-16 ENCOUNTER — Encounter (HOSPITAL_COMMUNITY): Payer: Self-pay

## 2021-06-16 VITALS — BP 132/68 | HR 67 | Temp 97.8°F | Resp 16 | Ht 72.0 in | Wt 201.0 lb

## 2021-06-16 DIAGNOSIS — Z01812 Encounter for preprocedural laboratory examination: Secondary | ICD-10-CM | POA: Diagnosis not present

## 2021-06-16 DIAGNOSIS — Z79899 Other long term (current) drug therapy: Secondary | ICD-10-CM | POA: Diagnosis not present

## 2021-06-16 DIAGNOSIS — D649 Anemia, unspecified: Secondary | ICD-10-CM

## 2021-06-16 HISTORY — DX: Aphasia: R47.01

## 2021-06-16 LAB — CBC
HCT: 41.4 % (ref 39.0–52.0)
Hemoglobin: 13.9 g/dL (ref 13.0–17.0)
MCH: 31.2 pg (ref 26.0–34.0)
MCHC: 33.6 g/dL (ref 30.0–36.0)
MCV: 93 fL (ref 80.0–100.0)
Platelets: 176 10*3/uL (ref 150–400)
RBC: 4.45 MIL/uL (ref 4.22–5.81)
RDW: 14.2 % (ref 11.5–15.5)
WBC: 5.7 10*3/uL (ref 4.0–10.5)
nRBC: 0 % (ref 0.0–0.2)

## 2021-06-24 NOTE — Anesthesia Preprocedure Evaluation (Addendum)
Anesthesia Evaluation  ?Patient identified by MRN, date of birth, ID band ?Patient awake ? ? ? ?Reviewed: ?Allergy & Precautions, NPO status , Patient's Chart, lab work & pertinent test results ? ?Airway ?Mallampati: I ? ?TM Distance: >3 FB ?Neck ROM: Full ? ? ? Dental ?no notable dental hx. ?(+) Teeth Intact, Dental Advisory Given ?  ?Pulmonary ?former smoker, PE ?  ?Pulmonary exam normal ?breath sounds clear to auscultation ? ? ? ? ? ? Cardiovascular ?negative cardio ROS ?Normal cardiovascular exam ?Rhythm:Regular Rate:Normal ? ? ?  ?Neuro/Psych ?negative neurological ROS ? negative psych ROS  ? GI/Hepatic ?Neg liver ROS, GERD  ,  ?Endo/Other  ?Hypothyroidism  ? Renal/GU ?negative Renal ROS  ?negative genitourinary ?  ?Musculoskeletal ? ?(+) Fibromyalgia - ? Abdominal ?  ?Peds ? Hematology ? ?(+) Blood dyscrasia (on eliquis), ,   ?Anesthesia Other Findings ? ? Reproductive/Obstetrics ? ?  ? ? ? ? ? ? ? ? ? ? ? ? ? ?  ?  ? ? ? ? ? ? ? ?Anesthesia Physical ?Anesthesia Plan ? ?ASA: 3 ? ?Anesthesia Plan: General  ? ?Post-op Pain Management:   ? ?Induction: Intravenous ? ?PONV Risk Score and Plan: 3 and Ondansetron, Dexamethasone and Treatment may vary due to age or medical condition ? ?Airway Management Planned: Oral ETT ? ?Additional Equipment:  ? ?Intra-op Plan:  ? ?Post-operative Plan: Extubation in OR ? ?Informed Consent: I have reviewed the patients History and Physical, chart, labs and discussed the procedure including the risks, benefits and alternatives for the proposed anesthesia with the patient or authorized representative who has indicated his/her understanding and acceptance.  ? ? ? ?Dental advisory given ? ?Plan Discussed with: CRNA ? ?Anesthesia Plan Comments:   ? ? ? ? ? ?Anesthesia Quick Evaluation ? ?

## 2021-06-25 ENCOUNTER — Ambulatory Visit (HOSPITAL_BASED_OUTPATIENT_CLINIC_OR_DEPARTMENT_OTHER): Payer: Medicare Other | Admitting: Anesthesiology

## 2021-06-25 ENCOUNTER — Encounter (HOSPITAL_COMMUNITY)
Admission: RE | Disposition: A | Discharge status: Home / Self Care | Payer: Self-pay | Source: Home / Self Care | Attending: Urology

## 2021-06-25 ENCOUNTER — Ambulatory Visit (HOSPITAL_COMMUNITY): Payer: Medicare Other | Admitting: Physician Assistant

## 2021-06-25 ENCOUNTER — Ambulatory Visit (HOSPITAL_COMMUNITY): Payer: Medicare Other

## 2021-06-25 ENCOUNTER — Ambulatory Visit (HOSPITAL_COMMUNITY)
Admission: RE | Admit: 2021-06-25 | Discharge: 2021-06-25 | Disposition: A | Discharge status: Home / Self Care | Payer: Medicare Other | Attending: Urology | Admitting: Urology

## 2021-06-25 ENCOUNTER — Encounter (HOSPITAL_COMMUNITY): Payer: Self-pay | Admitting: Urology

## 2021-06-25 DIAGNOSIS — E039 Hypothyroidism, unspecified: Secondary | ICD-10-CM | POA: Insufficient documentation

## 2021-06-25 DIAGNOSIS — N329 Bladder disorder, unspecified: Secondary | ICD-10-CM | POA: Diagnosis not present

## 2021-06-25 DIAGNOSIS — Z7901 Long term (current) use of anticoagulants: Secondary | ICD-10-CM | POA: Insufficient documentation

## 2021-06-25 DIAGNOSIS — N401 Enlarged prostate with lower urinary tract symptoms: Secondary | ICD-10-CM | POA: Insufficient documentation

## 2021-06-25 DIAGNOSIS — Z8551 Personal history of malignant neoplasm of bladder: Secondary | ICD-10-CM | POA: Diagnosis not present

## 2021-06-25 DIAGNOSIS — N138 Other obstructive and reflux uropathy: Secondary | ICD-10-CM | POA: Diagnosis not present

## 2021-06-25 DIAGNOSIS — N3289 Other specified disorders of bladder: Secondary | ICD-10-CM

## 2021-06-25 DIAGNOSIS — M797 Fibromyalgia: Secondary | ICD-10-CM | POA: Diagnosis not present

## 2021-06-25 DIAGNOSIS — N3000 Acute cystitis without hematuria: Secondary | ICD-10-CM | POA: Diagnosis not present

## 2021-06-25 DIAGNOSIS — I2699 Other pulmonary embolism without acute cor pulmonale: Secondary | ICD-10-CM | POA: Diagnosis not present

## 2021-06-25 DIAGNOSIS — N289 Disorder of kidney and ureter, unspecified: Secondary | ICD-10-CM | POA: Diagnosis not present

## 2021-06-25 DIAGNOSIS — Z87891 Personal history of nicotine dependence: Secondary | ICD-10-CM | POA: Diagnosis not present

## 2021-06-25 DIAGNOSIS — N3020 Other chronic cystitis without hematuria: Secondary | ICD-10-CM | POA: Diagnosis not present

## 2021-06-25 HISTORY — PX: CYSTOSCOPY WITH URETEROSCOPY AND STENT PLACEMENT: SHX6377

## 2021-06-25 HISTORY — PX: TRANSURETHRAL RESECTION OF BLADDER TUMOR WITH MITOMYCIN-C: SHX6459

## 2021-06-25 SURGERY — TRANSURETHRAL RESECTION OF BLADDER TUMOR WITH MITOMYCIN-C
Anesthesia: General | Laterality: Right

## 2021-06-25 MED ORDER — CHLORHEXIDINE GLUCONATE 0.12 % MT SOLN
15.0000 mL | Freq: Once | OROMUCOSAL | Status: AC
  Administered 2021-06-25: 15 mL via OROMUCOSAL

## 2021-06-25 MED ORDER — FENTANYL CITRATE (PF) 100 MCG/2ML IJ SOLN
INTRAMUSCULAR | Status: AC
  Filled 2021-06-25: qty 2

## 2021-06-25 MED ORDER — IOHEXOL 300 MG/ML  SOLN
INTRAMUSCULAR | Status: DC | PRN
  Administered 2021-06-25: 11 mL

## 2021-06-25 MED ORDER — SODIUM CHLORIDE 0.9% FLUSH: 3.0000 mL | Freq: Two times a day (BID) | INTRAVENOUS | Status: DC

## 2021-06-25 MED ORDER — 0.9 % SODIUM CHLORIDE (POUR BTL) OPTIME
TOPICAL | Status: DC | PRN
  Administered 2021-06-25: 1000 mL

## 2021-06-25 MED ORDER — FENTANYL CITRATE PF 50 MCG/ML IJ SOSY: 25.0000 ug | PREFILLED_SYRINGE | INTRAMUSCULAR | Status: DC | PRN

## 2021-06-25 MED ORDER — FENTANYL CITRATE (PF) 100 MCG/2ML IJ SOLN
INTRAMUSCULAR | Status: DC | PRN
  Administered 2021-06-25 (×2): 50 ug via INTRAVENOUS

## 2021-06-25 MED ORDER — ROCURONIUM BROMIDE 10 MG/ML (PF) SYRINGE
PREFILLED_SYRINGE | INTRAVENOUS | Status: DC | PRN
Start: 2021-06-25 — End: 2021-06-25
  Administered 2021-06-25: 70 mg via INTRAVENOUS

## 2021-06-25 MED ORDER — SUGAMMADEX SODIUM 200 MG/2ML IV SOLN
INTRAVENOUS | Status: DC | PRN
  Administered 2021-06-25: 200 mg via INTRAVENOUS

## 2021-06-25 MED ORDER — DEXAMETHASONE SODIUM PHOSPHATE 10 MG/ML IJ SOLN
INTRAMUSCULAR | Status: DC | PRN
Start: 2021-06-25 — End: 2021-06-25
  Administered 2021-06-25: 5 mg via INTRAVENOUS

## 2021-06-25 MED ORDER — ONDANSETRON HCL 4 MG/2ML IJ SOLN
INTRAMUSCULAR | Status: DC | PRN
  Administered 2021-06-25: 4 mg via INTRAVENOUS

## 2021-06-25 MED ORDER — ORAL CARE MOUTH RINSE: 15.0000 mL | Freq: Once | OROMUCOSAL | Status: AC

## 2021-06-25 MED ORDER — HYDROCODONE-ACETAMINOPHEN 5-325 MG PO TABS
1.0000 | ORAL_TABLET | Freq: Four times a day (QID) | ORAL | 0 refills | Status: DC | PRN
Start: 2021-06-25 — End: 2022-01-03

## 2021-06-25 MED ORDER — LACTATED RINGERS IV SOLN: INTRAVENOUS | Status: DC

## 2021-06-25 MED ORDER — SODIUM CHLORIDE 0.9 % IR SOLN
Status: DC | PRN
  Administered 2021-06-25: 6000 mL

## 2021-06-25 MED ORDER — CIPROFLOXACIN IN D5W 400 MG/200ML IV SOLN
400.0000 mg | INTRAVENOUS | Status: AC
  Administered 2021-06-25: 400 mg via INTRAVENOUS
  Filled 2021-06-25: qty 200

## 2021-06-25 MED ORDER — LIDOCAINE 2% (20 MG/ML) 5 ML SYRINGE
INTRAMUSCULAR | Status: DC | PRN
  Administered 2021-06-25: 80 mg via INTRAVENOUS

## 2021-06-25 MED ORDER — GEMCITABINE CHEMO FOR BLADDER INSTILLATION 2000 MG
2000.0000 mg | Freq: Once | INTRAVENOUS | Status: DC
  Filled 2021-06-25: qty 52.6

## 2021-06-25 MED ORDER — PROPOFOL 10 MG/ML IV BOLUS
INTRAVENOUS | Status: DC | PRN
  Administered 2021-06-25: 120 mg via INTRAVENOUS

## 2021-06-25 MED ORDER — PHENYLEPHRINE 40 MCG/ML (10ML) SYRINGE FOR IV PUSH (FOR BLOOD PRESSURE SUPPORT)
PREFILLED_SYRINGE | INTRAVENOUS | Status: DC | PRN
Start: 2021-06-25 — End: 2021-06-25
  Administered 2021-06-25: 160 ug via INTRAVENOUS

## 2021-06-25 MED ORDER — PROPOFOL 10 MG/ML IV BOLUS
INTRAVENOUS | Status: AC
  Filled 2021-06-25: qty 20

## 2021-06-25 MED ORDER — ACETAMINOPHEN 500 MG PO TABS
1000.0000 mg | ORAL_TABLET | Freq: Once | ORAL | Status: AC
  Administered 2021-06-25: 1000 mg via ORAL
  Filled 2021-06-25: qty 2

## 2021-06-25 SURGICAL SUPPLY — 29 items
BAG COUNTER SPONGE SURGICOUNT (BAG) ×3 IMPLANT
BAG DRN RND TRDRP ANRFLXCHMBR (UROLOGICAL SUPPLIES) ×2
BAG SPNG CNTER NS LX DISP (BAG) ×2
BAG URINE DRAIN 2000ML AR STRL (UROLOGICAL SUPPLIES) ×1 IMPLANT
BAG URO CATCHER STRL LF (MISCELLANEOUS) ×3 IMPLANT
CATH FOLEY 2WAY SLVR  5CC 20FR (CATHETERS) ×3
CATH FOLEY 2WAY SLVR 5CC 20FR (CATHETERS) IMPLANT
CATH FOLEY 3WAY 30CC 22FR (CATHETERS) IMPLANT
CATH URETL OPEN 5X70 (CATHETERS) ×1 IMPLANT
DRAPE FOOT SWITCH (DRAPES) ×3 IMPLANT
DRSG TEGADERM 2-3/8X2-3/4 SM (GAUZE/BANDAGES/DRESSINGS) ×1 IMPLANT
ELECT REM PT RETURN 15FT ADLT (MISCELLANEOUS) ×3 IMPLANT
GLOVE SURG POLYISO LF SZ8 (GLOVE) IMPLANT
GOWN STRL REUS W/TWL XL LVL3 (GOWN DISPOSABLE) ×3 IMPLANT
GUIDEWIRE STR DUAL SENSOR (WIRE) ×1 IMPLANT
HOLDER FOLEY CATH W/STRAP (MISCELLANEOUS) ×1 IMPLANT
KIT TURNOVER KIT A (KITS) ×3 IMPLANT
LOOP CUT BIPOLAR 24F LRG (ELECTROSURGICAL) ×1 IMPLANT
MANIFOLD NEPTUNE II (INSTRUMENTS) ×3 IMPLANT
PACK CYSTO (CUSTOM PROCEDURE TRAY) ×3 IMPLANT
SET IRRIG Y TYPE TUR BLADDER L (SET/KITS/TRAYS/PACK) ×3 IMPLANT
SHEATH URETERAL 12FRX35CM (MISCELLANEOUS) ×1 IMPLANT
STENT URET 6FRX26 CONTOUR (STENTS) ×1 IMPLANT
SUT ETHILON 3 0 PS 1 (SUTURE) IMPLANT
SYR 30ML LL (SYRINGE) IMPLANT
SYR TOOMEY IRRIG 70ML (MISCELLANEOUS) ×3
SYRINGE TOOMEY IRRIG 70ML (MISCELLANEOUS) IMPLANT
TUBING CONNECTING 10 (TUBING) ×3 IMPLANT
TUBING UROLOGY SET (TUBING) ×3 IMPLANT

## 2021-06-25 NOTE — Discharge Instructions (Signed)
You may remove the foley in the morning if you feel comfortable doing that.  If you aren't sent home with a syringe to remove the catheter, you can drain the balloon by cutting of the side arm with the plastic nipple.  After the fluid drains, the catheter should easily slide out. ? ? ?You may remove the ureteral stent on Monday morning by pulling the attached string.   The stent is blue with a loop on each end.  If you don't feel you can do that, you need to call the office to come have it removed. ? ? ?You may resume the Eliquis Monday if you are having no active bleeding.   ?

## 2021-06-25 NOTE — Interval H&P Note (Signed)
History and Physical Interval Note: ? ?06/25/2021 ?8:47 AM ? ?Gilbert Clark  has presented today for surgery, with the diagnosis of BLADDER TUMOR.  The various methods of treatment have been discussed with the patient and family. After consideration of risks, benefits and other options for treatment, the patient has consented to  Procedure(s): ?CYSTOSCOPY BILATERAL RETROGRADES TRANSURETHRAL RESECTION OF BLADDER TUMOR WITH GEMCITABINE (Bilateral) as a surgical intervention.  The patient's history has been reviewed, patient examined, no change in status, stable for surgery.  I have reviewed the patient's chart and labs.  Questions were answered to the patient's satisfaction.   ? ? ?Irine Seal ? ? ?

## 2021-06-25 NOTE — Anesthesia Procedure Notes (Signed)
Procedure Name: Intubation ?Date/Time: 06/25/2021 9:48 AM ?Performed by: Gean Maidens, CRNA ?Pre-anesthesia Checklist: Patient identified, Emergency Drugs available, Suction available, Patient being monitored and Timeout performed ?Patient Re-evaluated:Patient Re-evaluated prior to induction ?Oxygen Delivery Method: Circle system utilized ?Preoxygenation: Pre-oxygenation with 100% oxygen ?Induction Type: IV induction ?Ventilation: Mask ventilation without difficulty ?Laryngoscope Size: Mac and 4 ?Grade View: Grade I ?Tube type: Oral ?Tube size: 7.0 mm ?Number of attempts: 1 ?Airway Equipment and Method: Stylet ?Placement Confirmation: ETT inserted through vocal cords under direct vision, positive ETCO2 and breath sounds checked- equal and bilateral ?Secured at: 23 cm ?Tube secured with: Tape ?Dental Injury: Teeth and Oropharynx as per pre-operative assessment  ? ? ? ? ?

## 2021-06-25 NOTE — Anesthesia Postprocedure Evaluation (Signed)
Anesthesia Post Note ? ?Patient: Gilbert Clark ? ?Procedure(s) Performed: CYSTOSCOPY BILATERAL RETROGRADES TRANSURETHRAL RESECTION OF BLADDER TUMOR (Bilateral) ?URETEROSCOPY AND STENT PLACEMENT (Right) ? ?  ? ?Patient location during evaluation: PACU ?Anesthesia Type: General ?Level of consciousness: awake and alert ?Pain management: pain level controlled ?Vital Signs Assessment: post-procedure vital signs reviewed and stable ?Respiratory status: spontaneous breathing, nonlabored ventilation, respiratory function stable and patient connected to nasal cannula oxygen ?Cardiovascular status: blood pressure returned to baseline and stable ?Postop Assessment: no apparent nausea or vomiting ?Anesthetic complications: no ? ? ?No notable events documented. ? ?Last Vitals:  ?Vitals:  ? 06/25/21 1130 06/25/21 1145  ?BP: 140/75 140/71  ?Pulse: (!) 49   ?Resp: 15   ?Temp:  36.4 ?C  ?SpO2: 99% 99%  ?  ?Last Pain:  ?Vitals:  ? 06/25/21 1145  ?TempSrc:   ?PainSc: 0-No pain  ? ? ?  ?  ?  ?  ?  ?  ? ?Janara Klett L Saphyre Cillo ? ? ? ? ?

## 2021-06-25 NOTE — Progress Notes (Incomplete)
AssistedDr. Birdie Sons with right FICB  block. Side rails up, monitors on throughout procedure. See vital signs in flow sheet. Tolerated Procedure well.

## 2021-06-25 NOTE — Op Note (Signed)
Procedure: 1.  Cystoscopy with bilateral retrograde pyelograms and interpretation. ?2.  Right ureteroscopy. ?3.  Transurethral resection of left lateral wall bladder lesion approximately 2.5 x 1 cm in size. ?4.  Cystoscopy with right ureteral stent insertion. ?5.  Application fluoroscopy. ? ?Preop diagnosis: History of bladder cancer with left lateral wall bladder lesion. ? ?Postop diagnosis: Same. ? ?Surgeon: Dr. Irine Seal. ? ?Anesthesia: General. ? ?Specimen: Bladder lesion chips. ? ?Drains: 1.  6 French by 26 cm right contour double-J stent with tether. ?2.  20 French Foley catheter. ? ?EBL: None. ? ?Complications: None. ? ?Indications: Patient is an 87 year old male with a history of bladder cancer who was found in surveillance cystoscopy to have erythematous lesion on the right lateral wall that was felt to require biopsy for further evaluation.  He has not had recent upper tract studies a bilateral retrograde pyelography was also felt to be indicated. ? ?Procedure: He was taken operating room and was given Cipro.  A general anesthetic was induced and he was intubated and paralyzed.  He was placed in lithotomy position and fitted with PAS hose.  His perineum and genitalia were prepped with Betadine solution he was draped in usual sterile fashion. ? ?Cystoscopy was performed using the 21 [place] scope and 30 and 70 degree lenses.  Examination revealed a normal urethra.  The external sphincter was intact.  The prostatic urethra was approximately 4 cm in length with bilobar hyperplasia with coaptation and obstruction.  The left lobe was more prominent than the right.  There was no significant middle lobe.  Examination of bladder demonstrated moderate trabeculation.  There was patchy erythema on the left lateral wall toward the floor without obvious papillary fronds but there is concern for carcinoma in situ.  There was some mucosal blanching of the floor consistent with a prior area of resection.  The ureteral  orifice on the left was slightly laterally displaced.  The right ureteral orifice was unremarkable. ? ?Bilateral retrograde pyelography was performed with a 5 French opening catheter and Omnipaque. ? ?The left retrograde pyelogram demonstrated normal ureter and intrarenal collecting system without filling defects. ? ?The right retrograde pyelogram demonstrated a normal ureter with a possible filling defect versus posterior calyx and papillary impression of the upper pole.  Multiple views were obtained of this area and the possible filling defect did not resolve with filling and draining of the collecting system.  I also reviewed prior retrograde pyelograms and CT scans did not see anything that would suggest this is a pre-existing finding. ? ?I felt that ureteroscopy was indicated so a sensor wire was passed to the kidney.  A 36 cm 11/13 [place] digital access sheath inner core was passed over the wire into the proximal ureter and the single-lumen digital flexible scope was then advanced over the wire without the sheath initially but I was unable to get it by the meatus.  The assembled sheath was then advanced over the wire into the proximal ureter and the wire and inner core were removed. ? ?The single-lumen digital flexible scope was then passed through the sheath into the kidney and the collecting system was thoroughly inspected.  No lesions were identified and it is felt that the finding on retrograde pyelography is likely just a posterior papillary impression. ? ?The wire was replaced to the kidney and the sheath was removed.  The urethra had previously been calibrated to 30 [place] with [employer] sounds in preparation for TUR BT before the ureteroscopy was performed.  A 26 French continuous-flow resectoscope sheath was then placed alongside the wire with the aid of the visual obturator.  The visual obturator was replaced with the [facility] handle with a bipolar loop and the 30 degree lens. ? ?The lesion on the  left lateral wall was then resected completely down into muscle but without bladder wall perforation.  The resection defect was approximately 1 x 2.5 cm.  The chips were retrieved and hemostasis was assured. ? ?The cystoscope was then reinserted over the wire and a 6 [place] by 26 cm contour double-J stent was advanced the kidney under fluoroscopic guidance.  The wire was removed, leaving a good coil in the kidney and a good coil in the bladder.  The cystoscope was removed leaving the stent string exiting urethra.  A 20 French Foley catheter was inserted and the balloon was filled with 10 mL of sterile fluid.  The stent string was secured to the patient's penis and the cath was placed straight drainage.  He was taken down from lithotomy position, his anesthetic was reversed and he was moved to recovery in stable condition.  There were no complications. ? ? ?

## 2021-06-25 NOTE — H&P (Signed)
CC: Bladder cancer f/u.   06/09/21: Shahzain returns today in f/u for his history of bladder cancer with the last TURBT in 10/21 with LG NMIBC on pain and BPH with BOO with an elevated PVR. His PVR is stable at 242m. His IPSS is 16. He remains on finasteride and tamsulosin 0.'8mg'$  daily and his PSA is 3.11 which is appropriately suppressed. His UA is clear. He has had no gross hematuria.   12/09/20: WKacireturns today in f/u for his history of bladder cancer and BPH with BOO and an elevated PVR. His PVR today is down to 2731m His UA is clear. His IPSS is 18 with nocturia x 4 and some frequency with a sensation of incomplete emptying. He remains on tamsulosin 2 daily. He remains on Eliquis and he has aphasia with some non-specific findings on MRI in 1/22.   06/08/20: WiEmmaueleturns today for surveillance cystoscopy for his history of bladder cancer that was last resected on 01/21/20 and was LG NMIBC. His UA today has 3-10 RBC's. He had post op retention and is on tamsulosin 2 daily. His Prostate was 10085mast fall on UA and his PVR today is up to 425m74m 01/30/20: WillNatashaurns today in f/u. He had LG NMIBC on his path on the recent TURBT on 01/21/20. He had a prior LG tumor in 2011. He had post op retention despite keeping the foley overnight post op. He had a TOV on 01/24/20 and is voiding with some residual weakness of the stream but that is improving. He remains on tamsulosin 2 daily. He had a negative culture on 01/24/20. He has 3-10 RBC's on UA today. His PVR is 286ml75may which is stable over the last 6 months.   01/24/2020: Patient had recent TURBT with his primary urologist. A Foley catheter was left at the conclusion of the procedure. He was instructed to remove the catheter a few days after the procedure and when he did so he progressively developed painful inability to void. Seen earlier this week and had Foley catheter placed. He returns today for trial of void. He continues alpha-blocker  therapy in the form of tamsulosin as directed.     AUA Symptom Score: 50% of the time he has the sensation of not emptying his bladder completely when finished urinating. Less than 50% of the time he has to urinate again fewer than two hours after he has finished urinating. Less than 50% of the time he has to start and stop again several times when he urinates. 50% of the time he finds it difficult to postpone urination. Less than 50% of the time he has a weak urinary stream. Less than 20% of the time he has to push or strain to begin urination. He has to get up to urinate 3 times from the time he goes to bed until the time he gets up in the morning.   Calculated AUA Symptom Score: 16    ALLERGIES: Augmentin TABS EPINEPHrine HCl SOLN Sulfa Drugs    MEDICATIONS: Finasteride 5 mg tablet 1 tablet PO Daily  Levothyroxine Sodium 137 mcg tablet  Crestor 10 MG Oral Tablet Oral  Eliquis 2.5 mg tablet  Prednisone  Tamsulosin Hcl 0.4 mg capsule 0 Oral     GU PSH: Bladder Instill AntiCA Agent - 01/21/2020 Complex Uroflow - 06/08/2020, 06/19/2019 Cystoscopy - 12/09/2020, 06/08/2020, 12/25/2019, 01/09/2019, 2019 Cystoscopy Fulguration - 2008 Cystoscopy TURBT 2-5 cm - 01/21/2020, 2011 Locm 300-'399Mg'$ /Ml Iodine,1Ml - 2019  PSH Notes: Cystoscopy With Fulguration Medium Lesion (2-5cm), Cystoscopy With Fulguration, Primary Repair Of Ruptured Achilles Tendon, Bladder Surgery   NON-GU PSH: Cataract surgery Repair Achilles Tendon - 2008     GU PMH: BPH w/LUTS, I am going to add finasteride and reviewed the side effects. I will get a PSA in 6 months along with a PVR. - 12/09/2020, He has an increased PVR with worsening symptoms and BPH with BOO on cystoscopy but voided ok s/p cystoscopy. I discussed options including the addition of finasteride or consideration of a TURP. His prostate is 141m so I didn't think he was a good candidate for a Urolift or Rezum. He is reasonably content with his symptoms and  wants to wait for now and just return in 6 months for cystoscopy. He will continue the tamsulosin 2 daily. , - 06/08/2020, He voiding symptoms worsened post op and he had retention but is now improving. His PVR is elevated but stable. I will have him continue the tamsulosin but won't start finasteride because of his memory issues. He will have cystoscopy in 3 months and I will repeat a PVR and get a flowrate then . , - 01/30/2020, He will stay on tamsulosin 2 daily. His prostate volume is 1027mon [place] may be interested in Rezum therapy but I will hold off on that until we have the bladder tumor managed. , - 12/25/2019, He remains on tamsulosin but never took the finasteride. He seems to be having some memory issues and I will not resend the finasteride since that can aggravate dementia. He has some improvement in the LUTS but has a moderate PVR of 29758mI will recheck that in 6 months but don't think we need to change therapy at this time. , - 06/19/2019, Benign prostatic hyperplasia with urinary obstruction, - 2014 History of bladder cancer, No recurrences. He will return in 6 mo for cystoscopy. - 12/09/2020, He has no recurrent tumors. He has stable microhematuria with 3-10 RBC's, - 06/08/2020, Bladder Cancer, - 2014 Incomplete bladder emptying - 12/09/2020, PVR was up but I don't think it was a true PVR. He voided 262m32mp cysto with a PF of 10ml38m. I only instilled about 100ml 80mystoscopy., - 06/08/2020, - 01/30/2020, - 01/22/2020, - 12/25/2019, - 06/19/2019 Microscopic hematuria - 06/08/2020, - 12/25/2019, He has 10-20 RBC's. I will get a cytology and see when he last had upper tract imaging. he may need a CT., - 2019 Bladder Cancer Posterior, He had LG NMIBC. It has been 10 years since his prior tumor. I don't think he will need BCG at this time. - 01/30/2020, - 01/22/2020, He has recurrent tumor on the left posterior bladder wall that appears to be about 5mm an35muperficial. I will get him set up for a cystoscopy  with bil RTG's, TURBT and instillation of gemcitabine. Risks of bleeding, infection, bladder injury, need for secondary procedures, chemical cystitis, thrombotic events and anesthetic complications reviewed. , - 12/25/2019 Weak Urinary Stream - 01/30/2020, PF is 11ml/se2m - 06/19/2019 Urinary Retention - 01/24/2020 Nocturia - 2019 ED due to arterial insufficiency, Erectile dysfunction due to arterial insufficiency - 2014 Elevated PSA, Elevated prostate specific antigen (PSA) - 2014 Other microscopic hematuria, Microscopic Hematuria - 2014      PMH Notes:  2006-07-27 12:40:16 - Note: Venous Thrombosis   NON-GU PMH: Personal history of other diseases of the digestive system, History of esophageal reflux - 2014 Personal history of other endocrine, nutritional and metabolic disease, History of  hypercholesterolemia - 2014, History of hypothyroidism, - 2014 Personal history of other infectious and parasitic diseases, History of herpes zoster - 2014 DVT, History Encounter for general adult medical examination without abnormal findings, Encounter for preventive health examination Fibromyalgia    FAMILY HISTORY: None   SOCIAL HISTORY: Marital Status: Single Preferred Language: English; Race: White Current Smoking Status: Patient does not smoke anymore.   Tobacco Use Assessment Completed: Used Tobacco in last 30 days? Drinks 1 drink per day.  Drinks 3 caffeinated drinks per day.     Notes: Marital History - Widowed, Death In The Family Father, Death In The Family Mother, Occupation:, Tobacco Use, Alcohol Use, Caffeine Use   REVIEW OF SYSTEMS:    GU Review Male:   Patient reports get up at night to urinate, frequent urination, and leakage of urine. Patient denies stream starts and stops, trouble starting your stream, hard to postpone urination, erection problems, penile pain, have to strain to urinate , and burning/ pain with urination.  Gastrointestinal (Upper):   Patient denies nausea,  vomiting, and indigestion/ heartburn.  Gastrointestinal (Lower):   Patient denies diarrhea and constipation.  Constitutional:   Patient denies fever, night sweats, weight loss, and fatigue.  Skin:   Patient denies skin rash/ lesion and itching.  Eyes:   Patient denies blurred vision and double vision.  Ears/ Nose/ Throat:   Patient denies sore throat and sinus problems.  Hematologic/Lymphatic:   Patient denies swollen glands and easy bruising.  Cardiovascular:   Patient denies leg swelling and chest pains.  Respiratory:   Patient denies cough and shortness of breath.  Endocrine:   Patient denies excessive thirst.  Musculoskeletal:   Patient denies back pain and joint pain.  Neurological:   Patient denies headaches and dizziness.  Psychologic:   Patient denies depression and anxiety.   VITAL SIGNS: None   MULTI-SYSTEM PHYSICAL EXAMINATION:    Constitutional: Well-nourished. No physical deformities. Normally developed. Good grooming.  Respiratory: Normal breath sounds. No labored breathing, no use of accessory muscles.   Cardiovascular: Regular rate and rhythm. No murmur, no gallop. .      Complexity of Data:   06/02/21 02/20/12 01/17/11 08/17/09 05/19/09 02/20/09 02/26/08 01/22/07  PSA  Total PSA 3.11 ng/mL 5.63  5.39  4.20  4.75  7.17  3.79  4.14   Free PSA  1.72  1.52  0.70  0.69  1.55   1.15   % Free PSA  31  28  16.7  14.5  21.6   27.8     PROCEDURES:         Flexible Cystoscopy - 52000  Risks, benefits, and some of the potential complications of the procedure were discussed. 35m of 2% lidocaine jelly was instilled intraurethrally.  Cipro '500mg'$  given for antibiotic prophylaxis.     Meatus:  Normal size. Normal location. Normal condition.  Urethra:  No strictures.  External Sphincter:  Normal.  Verumontanum:  Normal.  Prostate:  Obstructing. Moderate hyperplasia.  Bladder Neck:  Non-obstructing.  Ureteral Orifices:  Normal location. Normal size. Normal shape. Effluxed  clear urine.  Bladder:  Moderate trabeculation. A few left lateral wall tumors. < 1/2 cm tumor. patchy erythema with small areas of papillary fronds. Normal mucosa. No stones.      The procedure was well tolerated and there were no complications.        PVR Ultrasound - [id number] Scanned Volume: 256 cc         Urinalysis Dipstick Dipstick Cont'd  Color: Yellow Bilirubin: Neg mg/dL  Appearance: Clear Ketones: Neg mg/dL  Specific Gravity: 1.015 Blood: Neg ery/uL  pH: 5.5 Protein: Neg mg/dL  Glucose: Neg mg/dL Urobilinogen: 0.2 mg/dL    Nitrites: Neg    Leukocyte Esterase: Neg leu/uL    ASSESSMENT:      ICD-10 Details  1 GU:   Bladder Cancer Lateral - C67.2 Chronic, Worsening - He has a small recurrence on the left lateral wall and needs Cystoscopy, Bil RTG and TURBT with gemcitabine instillation. Risks reviewed in detail.   2   BPH w/LUTS - N40.1 Chronic, Stable - He is voiding well on current therapy and his PSA is suppressed.   3   Elevated PSA - R97.20 Chronic, Stable   PLAN:           Schedule Return Visit/Planned Activity: Next Available Appointment - Schedule Surgery          Document

## 2021-06-25 NOTE — Transfer of Care (Signed)
Immediate Anesthesia Transfer of Care Note ? ?Patient: Gilbert Clark ? ?Procedure(s) Performed: CYSTOSCOPY BILATERAL RETROGRADES TRANSURETHRAL RESECTION OF BLADDER TUMOR (Bilateral) ?URETEROSCOPY AND STENT PLACEMENT (Right) ? ?Patient Location: PACU ? ?Anesthesia Type:General ? ?Level of Consciousness: awake, alert  and oriented ? ?Airway & Oxygen Therapy: Patient Spontanous Breathing and Patient connected to face mask oxygen ? ?Post-op Assessment: Report given to RN and Post -op Vital signs reviewed and stable ? ?Post vital signs: Reviewed and stable ? ?Last Vitals:  ?Vitals Value Taken Time  ?BP 142/75 06/25/21 1037  ?Temp    ?Pulse 62 06/25/21 1040  ?Resp 16 06/25/21 1040  ?SpO2 100 % 06/25/21 1040  ?Vitals shown include unvalidated device data. ? ?Last Pain:  ?Vitals:  ? 06/25/21 0748  ?TempSrc:   ?PainSc: 0-No pain  ?   ? ?  ? ?Complications: No notable events documented. ?

## 2021-06-26 ENCOUNTER — Encounter (HOSPITAL_COMMUNITY): Payer: Self-pay | Admitting: Urology

## 2021-06-28 DIAGNOSIS — E782 Mixed hyperlipidemia: Secondary | ICD-10-CM | POA: Diagnosis not present

## 2021-06-28 DIAGNOSIS — G8929 Other chronic pain: Secondary | ICD-10-CM | POA: Diagnosis not present

## 2021-06-28 LAB — SURGICAL PATHOLOGY

## 2021-07-02 DIAGNOSIS — R3914 Feeling of incomplete bladder emptying: Secondary | ICD-10-CM | POA: Diagnosis not present

## 2021-07-02 DIAGNOSIS — Z8551 Personal history of malignant neoplasm of bladder: Secondary | ICD-10-CM | POA: Diagnosis not present

## 2021-08-17 DIAGNOSIS — E039 Hypothyroidism, unspecified: Secondary | ICD-10-CM | POA: Diagnosis not present

## 2021-09-02 DIAGNOSIS — S6991XA Unspecified injury of right wrist, hand and finger(s), initial encounter: Secondary | ICD-10-CM | POA: Diagnosis not present

## 2021-11-17 DIAGNOSIS — Z0000 Encounter for general adult medical examination without abnormal findings: Secondary | ICD-10-CM | POA: Diagnosis not present

## 2021-11-17 DIAGNOSIS — Z79899 Other long term (current) drug therapy: Secondary | ICD-10-CM | POA: Diagnosis not present

## 2021-11-17 DIAGNOSIS — Z23 Encounter for immunization: Secondary | ICD-10-CM | POA: Diagnosis not present

## 2021-11-17 DIAGNOSIS — E039 Hypothyroidism, unspecified: Secondary | ICD-10-CM | POA: Diagnosis not present

## 2021-11-22 DIAGNOSIS — L237 Allergic contact dermatitis due to plants, except food: Secondary | ICD-10-CM | POA: Diagnosis not present

## 2021-11-25 DIAGNOSIS — Z23 Encounter for immunization: Secondary | ICD-10-CM | POA: Diagnosis not present

## 2021-11-25 DIAGNOSIS — C679 Malignant neoplasm of bladder, unspecified: Secondary | ICD-10-CM | POA: Diagnosis not present

## 2021-11-25 DIAGNOSIS — R4701 Aphasia: Secondary | ICD-10-CM | POA: Diagnosis not present

## 2021-11-25 DIAGNOSIS — E782 Mixed hyperlipidemia: Secondary | ICD-10-CM | POA: Diagnosis not present

## 2021-11-25 DIAGNOSIS — I499 Cardiac arrhythmia, unspecified: Secondary | ICD-10-CM | POA: Diagnosis not present

## 2021-11-25 DIAGNOSIS — E039 Hypothyroidism, unspecified: Secondary | ICD-10-CM | POA: Diagnosis not present

## 2021-11-25 DIAGNOSIS — B351 Tinea unguium: Secondary | ICD-10-CM | POA: Diagnosis not present

## 2021-11-25 DIAGNOSIS — G8929 Other chronic pain: Secondary | ICD-10-CM | POA: Diagnosis not present

## 2021-11-25 DIAGNOSIS — Z86711 Personal history of pulmonary embolism: Secondary | ICD-10-CM | POA: Diagnosis not present

## 2021-11-25 DIAGNOSIS — I4891 Unspecified atrial fibrillation: Secondary | ICD-10-CM | POA: Diagnosis not present

## 2021-11-26 ENCOUNTER — Telehealth: Payer: Self-pay

## 2021-11-26 NOTE — Telephone Encounter (Signed)
NOTES SCAMMED TO REFERRAL

## 2022-01-02 NOTE — Progress Notes (Unsigned)
Cardiology Office Note:    Date:  01/03/2022   ID:  Gilbert Clark, DOB 08-05-1935, MRN [id number]  PCP:  Cari Caraway, [place] Providers Cardiologist:  Werner Lean, MD     Referring MD: Cari Caraway, MD   CC: Palpitations Consulted for the evaluation of atrial fibrillation at the behest of Dr. Addison Lank  History of Present Illness:    Gilbert Clark is a 86 y.o. male with a hx of atrial fibrillation, Hx of provoked PE after Achilles surgery 15 years ago (had IVC filter or unclear etiology).  History of PMR without Giant Cell Arteritis.  Patient notes that he is feeling pretty well.   At his physical felt short of breath while standing up.  No palpitations.  Had an EKG and was found to have atrial fibrillation  Able to walk without incident. 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 Cooks his own meals and lives by himself.  Has had no chest pain, chest pressure, chest tightness, chest stinging.  Patient exertion notable for walking and feels no symptoms.  Does resistance band training with no symptoms.  No shortness of breath, DOE that is occurs spontaneously. Marland Kitchen  No PND or orthopnea.  No weight gain, leg swelling , or abdominal swelling.  No syncope or near syncope . Notes  no palpitations or funny heart beats.     Had a fall 5 days ago; he had a mechanical fall.  Patient reports prior cardiac testing including atrial fibrillation while at prior visit.   Past Medical History:  Diagnosis Date   Anemia    Aphasia    Atrial fibrillation ([place])    Benign prostatic hyperplasia with lower urinary tract symptoms    Bladder cancer (HCC)    BPH (benign prostatic hyperplasia)    Constipation    Dementia (HCC)    Fibromyalgia    GERD (gastroesophageal reflux disease)    Headache    High risk medication use    History of adenomatous polyp of colon    Hx of blood clots    Hyperlipidemia    Hypothyroidism    Memory loss     Polymyalgia rheumatica ([place])    Pulmonary embolism ([place])    hx of     Past Surgical History:  Procedure Laterality Date   ACHILLES TENDON REPAIR  2004   bilateral cataract surgery   2020   CYSTOSCOPY W/ RETROGRADES Bilateral 01/21/2020   Procedure: CYSTOSCOPY WITH BILATERAL RETROGRADE;  Surgeon: Irine Seal, MD;  Location: [facility];  Service: Urology;  Laterality: Bilateral;   CYSTOSCOPY WITH URETEROSCOPY AND STENT PLACEMENT Right 06/25/2021   Procedure: URETEROSCOPY AND STENT PLACEMENT;  Surgeon: Irine Seal, MD;  Location: [facility];  Service: Urology;  Laterality: Right;   IVC FILTER INSERTION      2000   TRANSURETHRAL RESECTION OF BLADDER TUMOR WITH GYRUS (TURBT-GYRUS)     multiple times    TRANSURETHRAL RESECTION OF BLADDER TUMOR WITH MITOMYCIN-C N/A 01/21/2020   Procedure: TRANSURETHRAL RESECTION OF BLADDER TUMOR WITH GEMCITABINE;  Surgeon: Irine Seal, MD;  Location[facility];  Service: Urology;  Laterality: N/A;   TRANSURETHRAL RESECTION OF BLADDER TUMOR WITH MITOMYCIN-C Bilateral 06/25/2021   Procedure: CYSTOSCOPY BILATERAL RETROGRADES TRANSURETHRAL RESECTION OF BLADDER TUMOR;  Surgeon: Irine Seal, MD;  Location: [facility];  Service: Urology;  Laterality: Bilateral;    Current Medications: Current Meds  Medication Sig   acetaminophen (TYLENOL) 500 MG tablet Take 500 mg  by mouth every 6 (six) hours as needed for mild pain.   apixaban (ELIQUIS) 2.5 MG TABS tablet Take by mouth 2 (two) times daily.   augmented betamethasone dipropionate (DIPROLENE-AF) 0.05 % cream Apply 1 application topically 2 (two) times daily.   diclofenac Sodium (VOLTAREN) 1 % GEL Apply 4 g topically every 6 (six) hours as needed (pain).   finasteride (PROSCAR) 5 MG tablet Take 5 mg by mouth daily.   lactose free nutrition (BOOST) LIQD Take 237 mLs by mouth daily.   levothyroxine (SYNTHROID) 137 MCG tablet Take 137 mcg by mouth daily before breakfast.   Omega-3 Fatty Acids (FISH  OIL) 1000 MG CPDR Take 2 capsules by mouth 2 (two) times daily. 2- two times daily   OVER THE COUNTER MEDICATION Take 1 tablet by mouth daily. Vegan multivitamin - one daily   polyethylene glycol powder (GLYCOLAX/MIRALAX) 17 GM/SCOOP powder Take 8 g by mouth daily.   Prenatal Vit-Fe Fumarate-FA (MULTIVITAMIN-PRENATAL) 27-0.8 MG TABS tablet Take 1 tablet by mouth daily at 12 noon.   tamsulosin (FLOMAX) 0.4 MG CAPS capsule Take 0.4 mg by mouth 2 (two) times daily.   TURMERIC PO Take 500 mg by mouth daily.   vitamin C (ASCORBIC ACID) 500 MG tablet Take 500 mg by mouth 2 (two) times daily. 500 mg 2 times daily   Vitamin D, Cholecalciferol, 10 MCG (400 UNIT) TABS Take 400 Units by mouth daily.   [DISCONTINUED] HYDROcodone-acetaminophen (NORCO/VICODIN) 5-325 MG tablet Take 1 tablet by mouth every 6 (six) hours as needed for moderate pain.   [DISCONTINUED] Wheat Dextrin (BENEFIBER PO) Take 1 tablet by mouth 3 (three) times daily.     Allergies:   Augmentin [amoxicillin-pot clavulanate], Epinephrine, and Sulfa antibiotics   Social History   Socioeconomic History   Marital status: Widowed    Spouse name: Not on file   Number of children: 2   Years of education: 14   Highest education level: Some college, no degree  Occupational History    Comment: retired from [employer], [employer]  Tobacco Use   Smoking status: Former    Types: Cigarettes    Quit date: 04/18/1962    Years since quitting: 59.7   Smokeless tobacco: Never  Vaping Use   Vaping Use: Never used  Substance and Sexual Activity   Alcohol use: Not Currently    Comment: 05/27/20 1 beer weekly   Drug use: Never   Sexual activity: Not on file  Other Topics Concern   Not on file  Social History Narrative   05/27/20 lives alone   Social Determinants of Health   Financial Resource Strain: Not on file  Food Insecurity: Not on file  Transportation Needs: Not on file  Physical Activity: Not on file  Stress: Not on file  Social Connections: Not  on file     Family History: The patient's family history includes Appendicitis in his father; Breast cancer in his sister.  ROS:   Please see the history of present illness.    All other systems reviewed and are negative.  EKGs/Labs/Other Studies Reviewed:    The following studies were reviewed today:  EKG:  EKG is  ordered today.  The ekg ordered today demonstrates  11/26/21: atrial fibrillation LBBB rate 66 01/03/22: Sinus bradycardia LBBB  Transthoracic Echocardiogram: Date: 2007 Echo report Results:  A transthoracic complete 2D study was performed. Additional   evaluation included M-mode, complete spectral Doppler, and color   Doppler. This was a routine echocardiographic study. The study was  performed in the echo lab. The patient reported no pain pre or post   test. Image quality was adequate.   Recent Labs: 06/16/2021: Hemoglobin 13.9; Platelets 176  Recent Lipid Panel No results found for: "CHOL", "TRIG", "HDL", "CHOLHDL", "VLDL", "LDLCALC", "LDLDIRECT"       Physical Exam:    VS:  BP (!) 142/70   Pulse (!) 57   Ht 6' (1.829 m)   Wt 203 lb (92.1 kg)   SpO2 98%   BMI 27.53 kg/m     Wt Readings from Last 3 Encounters:  01/03/22 203 lb (92.1 kg)  06/25/21 201 lb (91.2 kg)  06/16/21 201 lb (91.2 kg)    Gen: no distress   Ears: Bilateral Pilar Plate Sign Cardiac: No Rubs or Gallops, no Murmur, RRR +2 radial pulses Respiratory: Clear to auscultation bilaterally, normal effort, normal  respiratory rate GI: Soft, nontender, non-distended  MS: Trace bilateral  edema;  moves all extremities Integument: Skin feels warm Neuro:  At time of evaluation, alert and oriented to person/place/time/situation Psych: Normal affect, patient feels well   ASSESSMENT:    1. PAF (paroxysmal atrial fibrillation) ([place])   2. LBBB (left bundle branch block)   3. Hypothyroidism, unspecified type   4. Acquired thrombophilia ([place])   5. History of pulmonary embolism   6. Elevated blood  pressure reading   7. History of aphasia    PLAN:    Paroxsymal Atrial Fibrillation - with history of PE (provoked IVC filter placed 15 year ago) - aphasia without prior history of stroke LBBB Hypothyroidism Elevated BP without evidence of HTN - Risk factors include Age X2 - CHADSVASC=2. -  Acquired Thrombophilia; we will let him recover from the fall; after his testing is done we will increase to '5mg'$  PO BID assuming no further falls - TSH low 0.33; per PCP; who made some changes in August - Will get obtain TTE; if BP is elevated on echo will start therapy and diagnose and HTN - will get heart monitor  Six months me or APP        Medication Adjustments/Labs and Tests Ordered: Current medicines are reviewed at length with the patient today.  Concerns regarding medicines are outlined above.  Orders Placed This Encounter  Procedures   LONG TERM MONITOR (3-14 DAYS)   EKG 12-Lead   ECHOCARDIOGRAM COMPLETE   No orders of the defined types were placed in this encounter.   Patient Instructions  Medication Instructions:  Your physician recommends that you continue on your current medications as directed. Please refer to the Current Medication list given to you today.  *If you need a refill on your cardiac medications before your next appointment, please call your pharmacy*   Lab Work: none If you have labs (blood work) drawn today and your tests are completely normal, you will receive your results only by: [place] (if you have MyChart) OR A paper copy in the mail If you have any lab test that is abnormal or we need to change your treatment, we will call you to review the results.   Testing/Procedures: Your physician has requested that you have an echocardiogram. Echocardiography is a painless test that uses sound waves to create images of your heart. It provides your doctor with information about the size and shape of your heart and how well your heart's chambers and  valves are working. This procedure takes approximately one hour. There are no restrictions for this procedure.  Your physician has requested that you wear  a heart monitor.    Follow-Up: At [facility], you and your health needs are our priority.  As part of our continuing mission to provide you with exceptional heart care, we have created designated Provider Care Teams.  These Care Teams include your primary Cardiologist (physician) and Advanced Practice Providers (APPs -  Physician Assistants and Nurse Practitioners) who all work together to provide you with the care you need, when you need it.   Your next appointment:   6 month(s)  The format for your next appointment:   In Person  Provider:   Werner Lean, MD  or Melina Copa, PA-C, Ermalinda Barrios, PA-C, or Christen Bame, NP         Other Instructions [place] Monitor Instructions  Your physician has requested you wear a ZIO patch monitor for 14 days.  This is a single patch monitor. [employer] supplies one patch monitor per enrollment. Additional stickers are not available. Please do not apply patch if you will be having a Nuclear Stress Test,  Echocardiogram, Cardiac CT, MRI, or Chest Xray during the period you would be wearing the  monitor. The patch cannot be worn during these tests. You cannot remove and re-apply the  ZIO XT patch monitor.  Your ZIO patch monitor will be mailed 3 day USPS to your address on file. It may take 3-5 days  to receive your monitor after you have been enrolled.  Once you have received your monitor, please review the enclosed instructions. Your monitor  has already been registered assigning a specific monitor serial # to you.  Billing and Patient Assistance Program Information  We have supplied [employer] with any of your insurance information on file for billing purposes. [employer] offers a sliding scale Patient Assistance Program for patients that do not have  insurance, or  whose insurance does not completely cover the cost of the ZIO monitor.  You must apply for the Patient Assistance Program to qualify for this discounted rate.  To apply, please call [employer] at [phone], select option 4, select option 2, ask to apply for  Patient Assistance Program. Theodore Demark will ask your household income, and how many people  are in your household. They will quote your out-of-pocket cost based on that information.  [employer] will also be able to set up a 28-month interest-free payment plan if needed.  Applying the monitor   Shave hair from upper left chest.  Hold abrader disc by orange tab. Rub abrader in 40 strokes over the upper left chest as  indicated in your monitor instructions.  Clean area with 4 enclosed alcohol pads. Let dry.  Apply patch as indicated in monitor instructions. Patch will be placed under collarbone on left  side of chest with arrow pointing upward.  Rub patch adhesive wings for 2 minutes. Remove white label marked "1". Remove the white  label marked "2". Rub patch adhesive wings for 2 additional minutes.  While looking in a mirror, press and release button in center of patch. A small green light will  flash 3-4 times. This will be your only indicator that the monitor has been turned on.  Do not shower for the first 24 hours. You may shower after the first 24 hours.  Press the button if you feel a symptom. You will hear a small click. Record Date, Time and  Symptom in the Patient Logbook.  When you are ready to remove the patch, follow instructions on the last 2 pages of Patient  Logbook. Stick patch monitor onto the last page of Patient Logbook.  Place Patient Logbook in the blue and white box. Use locking tab on box and tape box closed  securely. The blue and white box has prepaid postage on it. Please place it in the mailbox as  soon as possible. Your physician should have your test results approximately 7 days after the  monitor has been mailed  back to [facility].  Call [place] at [phone] if you have questions regarding  your ZIO XT patch monitor. Call them immediately if you see an orange light blinking on your  monitor.  If your monitor falls off in less than 4 days, contact our Monitor department at [phone].  If your monitor becomes loose or falls off after 4 days call [employer] at [phone] for  suggestions on securing your monitor   Important Information About Sugar         Signed, Werner Lean, MD  01/03/2022 9:16 AM    [place]

## 2022-01-03 ENCOUNTER — Ambulatory Visit: Payer: Medicare Other | Attending: Internal Medicine | Admitting: Internal Medicine

## 2022-01-03 ENCOUNTER — Encounter: Payer: Self-pay | Admitting: Internal Medicine

## 2022-01-03 ENCOUNTER — Ambulatory Visit: Payer: Medicare Other | Attending: Internal Medicine

## 2022-01-03 VITALS — BP 142/70 | HR 57 | Ht 72.0 in | Wt 203.0 lb

## 2022-01-03 DIAGNOSIS — E039 Hypothyroidism, unspecified: Secondary | ICD-10-CM

## 2022-01-03 DIAGNOSIS — I447 Left bundle-branch block, unspecified: Secondary | ICD-10-CM | POA: Diagnosis not present

## 2022-01-03 DIAGNOSIS — R030 Elevated blood-pressure reading, without diagnosis of hypertension: Secondary | ICD-10-CM

## 2022-01-03 DIAGNOSIS — D6869 Other thrombophilia: Secondary | ICD-10-CM | POA: Diagnosis not present

## 2022-01-03 DIAGNOSIS — Z86711 Personal history of pulmonary embolism: Secondary | ICD-10-CM

## 2022-01-03 DIAGNOSIS — I480 Paroxysmal atrial fibrillation: Secondary | ICD-10-CM

## 2022-01-03 DIAGNOSIS — Z87898 Personal history of other specified conditions: Secondary | ICD-10-CM | POA: Diagnosis not present

## 2022-01-03 NOTE — Progress Notes (Unsigned)
Enrolled for [employer] to mail a ZIO XT long term holter monitor to the patients address on file.  

## 2022-01-03 NOTE — Patient Instructions (Signed)
Medication Instructions:  Your physician recommends that you continue on your current medications as directed. Please refer to the Current Medication list given to you today.  *If you need a refill on your cardiac medications before your next appointment, please call your pharmacy*   Lab Work: none If you have labs (blood work) drawn today and your tests are completely normal, you will receive your results only by: [place] (if you have MyChart) OR A paper copy in the mail If you have any lab test that is abnormal or we need to change your treatment, we will call you to review the results.   Testing/Procedures: Your physician has requested that you have an echocardiogram. Echocardiography is a painless test that uses sound waves to create images of your heart. It provides your doctor with information about the size and shape of your heart and how well your heart's chambers and valves are working. This procedure takes approximately one hour. There are no restrictions for this procedure.  Your physician has requested that you wear a heart monitor.    Follow-Up: At [facility], you and your health needs are our priority.  As part of our continuing mission to provide you with exceptional heart care, we have created designated Provider Care Teams.  These Care Teams include your primary Cardiologist (physician) and Advanced Practice Providers (APPs -  Physician Assistants and Nurse Practitioners) who all work together to provide you with the care you need, when you need it.   Your next appointment:   6 month(s)  The format for your next appointment:   In Person  Provider:   Werner Lean, MD  or Melina Copa, PA-C, Ermalinda Barrios, PA-C, or Christen Bame, NP         Other Instructions [place] Monitor Instructions  Your physician has requested you wear a ZIO patch monitor for 14 days.  This is a single patch monitor. [employer] supplies one patch monitor  per enrollment. Additional stickers are not available. Please do not apply patch if you will be having a Nuclear Stress Test,  Echocardiogram, Cardiac CT, MRI, or Chest Xray during the period you would be wearing the  monitor. The patch cannot be worn during these tests. You cannot remove and re-apply the  ZIO XT patch monitor.  Your ZIO patch monitor will be mailed 3 day USPS to your address on file. It may take 3-5 days  to receive your monitor after you have been enrolled.  Once you have received your monitor, please review the enclosed instructions. Your monitor  has already been registered assigning a specific monitor serial # to you.  Billing and Patient Assistance Program Information  We have supplied [employer] with any of your insurance information on file for billing purposes. [employer] offers a sliding scale Patient Assistance Program for patients that do not have  insurance, or whose insurance does not completely cover the cost of the ZIO monitor.  You must apply for the Patient Assistance Program to qualify for this discounted rate.  To apply, please call [employer] at [phone], select option 4, select option 2, ask to apply for  Patient Assistance Program. Theodore Demark will ask your household income, and how many people  are in your household. They will quote your out-of-pocket cost based on that information.  [employer] will also be able to set up a 53-month interest-free payment plan if needed.  Applying the monitor   Shave hair from upper left chest.  Hold abrader  disc by orange tab. Rub abrader in 40 strokes over the upper left chest as  indicated in your monitor instructions.  Clean area with 4 enclosed alcohol pads. Let dry.  Apply patch as indicated in monitor instructions. Patch will be placed under collarbone on left  side of chest with arrow pointing upward.  Rub patch adhesive wings for 2 minutes. Remove white label marked "1". Remove the white  label marked "2". Rub patch  adhesive wings for 2 additional minutes.  While looking in a mirror, press and release button in center of patch. A small green light will  flash 3-4 times. This will be your only indicator that the monitor has been turned on.  Do not shower for the first 24 hours. You may shower after the first 24 hours.  Press the button if you feel a symptom. You will hear a small click. Record Date, Time and  Symptom in the Patient Logbook.  When you are ready to remove the patch, follow instructions on the last 2 pages of Patient  Logbook. Stick patch monitor onto the last page of Patient Logbook.  Place Patient Logbook in the blue and white box. Use locking tab on box and tape box closed  securely. The blue and white box has prepaid postage on it. Please place it in the mailbox as  soon as possible. Your physician should have your test results approximately 7 days after the  monitor has been mailed back to [facility].  Call [place] at [phone] if you have questions regarding  your ZIO XT patch monitor. Call them immediately if you see an orange light blinking on your  monitor.  If your monitor falls off in less than 4 days, contact our Monitor department at [phone].  If your monitor becomes loose or falls off after 4 days call [employer] at [phone] for  suggestions on securing your monitor   Important Information About Sugar

## 2022-01-07 DIAGNOSIS — I480 Paroxysmal atrial fibrillation: Secondary | ICD-10-CM

## 2022-01-12 DIAGNOSIS — Z8551 Personal history of malignant neoplasm of bladder: Secondary | ICD-10-CM | POA: Diagnosis not present

## 2022-01-12 DIAGNOSIS — R350 Frequency of micturition: Secondary | ICD-10-CM | POA: Diagnosis not present

## 2022-01-12 DIAGNOSIS — R3914 Feeling of incomplete bladder emptying: Secondary | ICD-10-CM | POA: Diagnosis not present

## 2022-01-13 ENCOUNTER — Ambulatory Visit (HOSPITAL_COMMUNITY): Payer: Medicare Other | Attending: Internal Medicine

## 2022-01-13 DIAGNOSIS — I480 Paroxysmal atrial fibrillation: Secondary | ICD-10-CM | POA: Diagnosis not present

## 2022-01-13 LAB — ECHOCARDIOGRAM COMPLETE
Area-P 1/2: 2.56 cm2
S' Lateral: 4 cm

## 2022-01-21 IMAGING — MR MR HEAD W/O CM
10 series · 48 of 48 positions shown · non-contrast
Comparison: No pertinent prior exams available for comparison.

CLINICAL DATA: Memory problem. Additional history provided by
scanning technologist: Patient reports memory problems for 2 years.

EXAM:
MRI HEAD WITHOUT CONTRAST
TECHNIQUE: Multiplanar, multiecho pulse sequences of the brain and surrounding
structures were obtained without intravenous contrast.

[Series 3: T1 · sagittal · 5.0mm · 0.45mm/px · 2 of 21 slices shown]
[im 1/21]
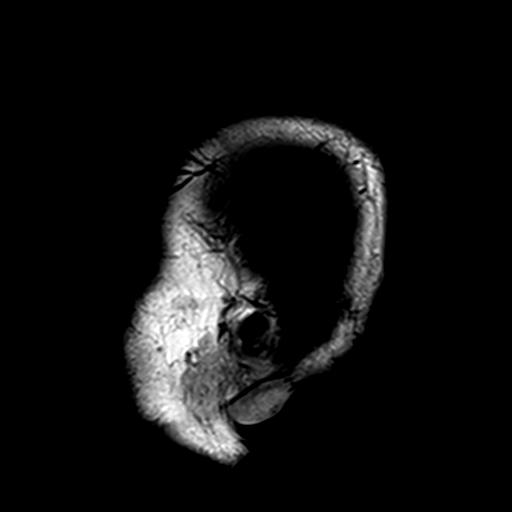
[im 21/21]
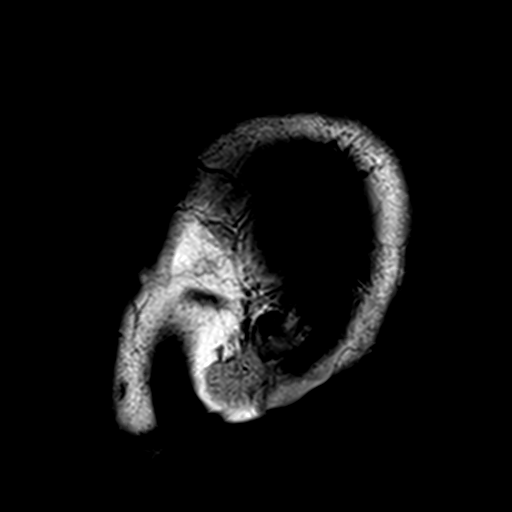

[Series 4: DWI · axial · 3.0mm · 1.80mm/px · z∈[+0,+162]mm · 9 of 105 slices shown (1 of 4)]
[im 1/105]
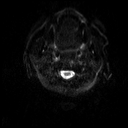
[im 14/105]
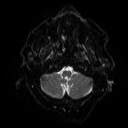
[im 27/105]
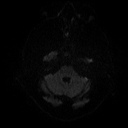
[im 40/105]
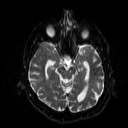
[im 53/105]
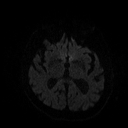
[im 66/105]
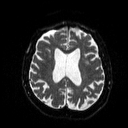
[im 79/105]
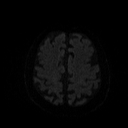
[im 92/105]
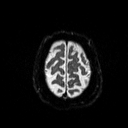
[im 105/105]
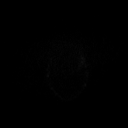

[Series 5: DWI · axial · 3.0mm · 1.80mm/px · z∈[+0,+162]mm · 5 of 54 slices shown (2 of 4)]
[im 1/54]
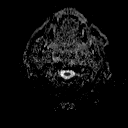
[im 14/54]
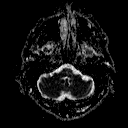
[im 27/54]
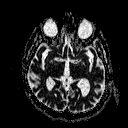
[im 40/54]
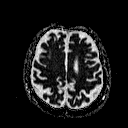
[im 54/54]
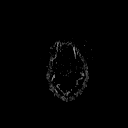

[Series 6: DWI · coronal · 5.0mm · 1.80mm/px · 6 of 68 slices shown (3 of 4)]
[im 1/68]
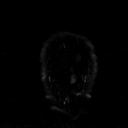
[im 14/68]
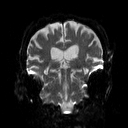
[im 27/68]
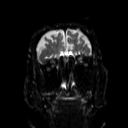
[im 41/68]
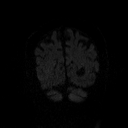
[im 54/68]
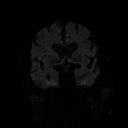
[im 68/68]
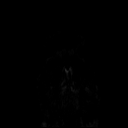

[Series 7: DWI · coronal · 5.0mm · 1.80mm/px · 3 of 34 slices shown (4 of 4)]
[im 1/34]
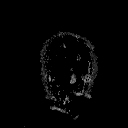
[im 17/34]
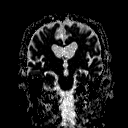
[im 34/34]
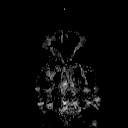

[Series 8: T2 · axial · 5.0mm · 0.60mm/px · z∈[+14,+173]mm · 2 of 24 slices shown (1 of 2)]
[im 1/24]
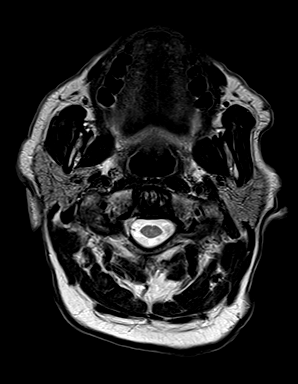
[im 24/24]
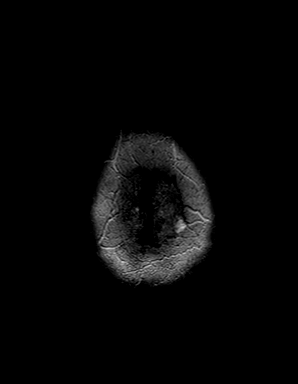

[Series 9: FLAIR · axial · 3.0mm · 0.45mm/px · z∈[+26,+159]mm · 3 of 30 slices shown]
[im 1/30]
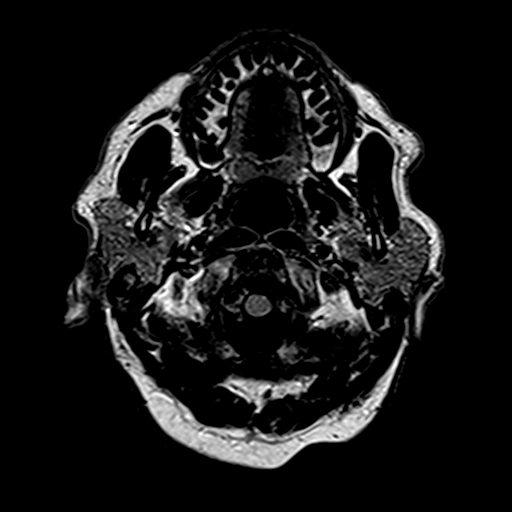
[im 15/30]
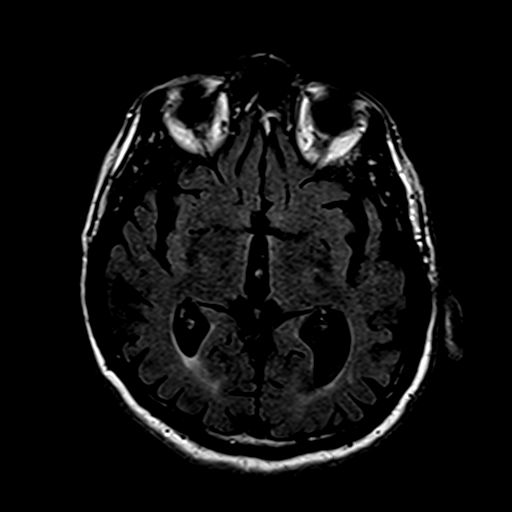
[im 30/30]
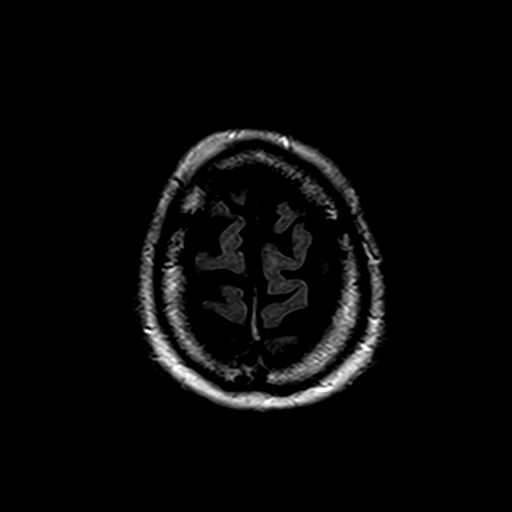

[Series 11: swi_images · axial · 4.0mm · 0.90mm/px · z∈[+24,+162]mm · 3 of 36 slices shown]
[im 1/36]
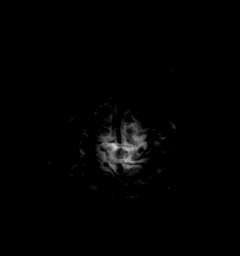
[im 18/36]
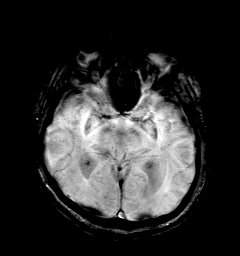
[im 36/36]
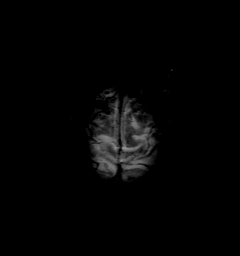

[Series 12: t1_mpr_tra · axial · 1.0mm · 0.71mm/px · z∈[+23,+164]mm · 13 of 144 slices shown]
[im 1/144]
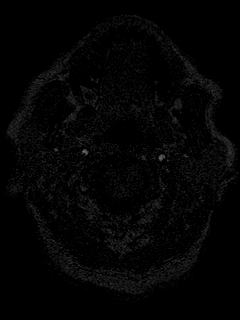
[im 12/144]
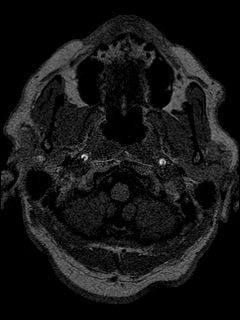
[im 24/144]
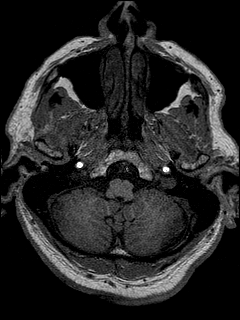
[im 36/144]
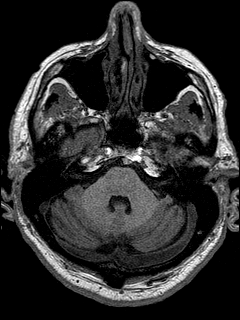
[im 48/144]
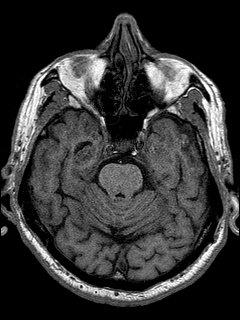
[im 60/144]
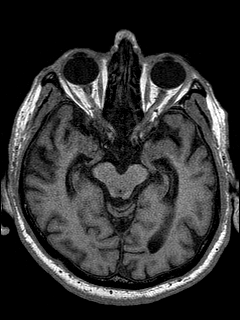
[im 72/144]
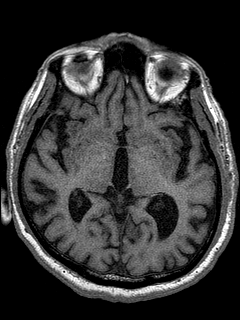
[im 84/144]
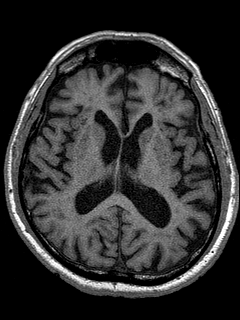
[im 96/144]
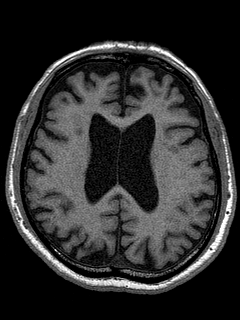
[im 108/144]
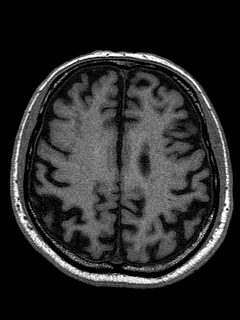
[im 120/144]
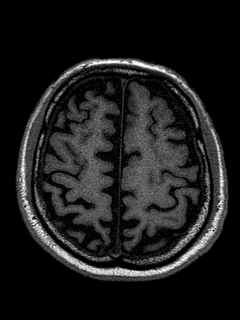
[im 132/144]
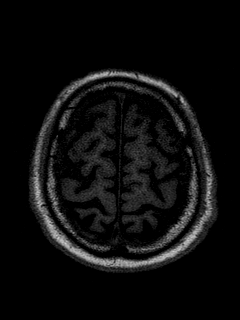
[im 144/144]
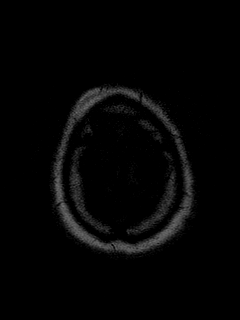

[Series 13: T2 · coronal · 5.0mm · 0.45mm/px · 2 of 25 slices shown (2 of 2)]
[im 1/25]
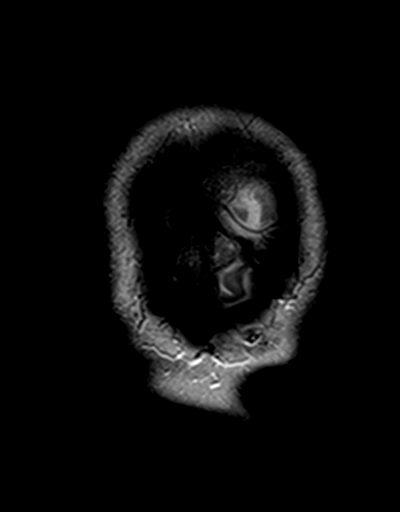
[im 25/25]
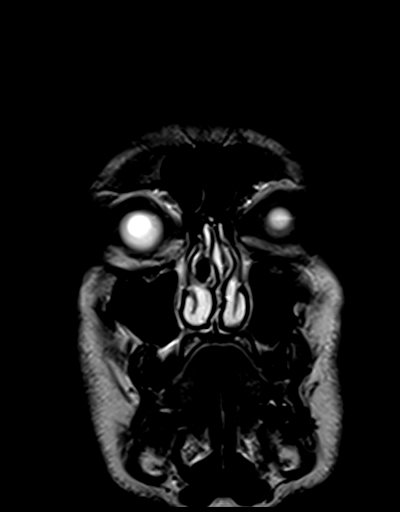

[48 of 48 positions shown; findings below may reference images not displayed]

FINDINGS: Brain:

Moderate cerebral atrophy without appreciable lobar predominance.
Associated prominence of the ventricles and sulci.

Tiny chronic cortical infarct within the posterior right frontal
lobe (series 13, image 13) (series 9, image 28).

Mild multifocal T2/FLAIR hyperintensity within the cerebral white
matter is nonspecific, but compatible with chronic small vessel
ischemic disease.

There is no acute infarct.

No evidence of intracranial mass.

No chronic intracranial blood products.

No extra-axial fluid collection.

No midline shift.

Vascular: Expected proximal arterial flow voids.

Skull and upper cervical spine: No focal marrow lesion.

Sinuses/Orbits: Visualized orbits show no acute finding. Bilateral
lens replacements. Mild ethmoid sinus mucosal thickening. Tiny left
maxillary sinus mucous retention cyst.
IMPRESSION: No evidence of acute intracranial abnormality.

Tiny chronic right posterior frontal lobe cortical infarct.

Moderate cerebral atrophy without appreciable lobar predominance.

Mild cerebral white matter chronic small vessel ischemic disease.

Mild ethmoid sinus mucosal thickening.

Small left maxillary sinus mucous retention cyst.

## 2022-01-31 ENCOUNTER — Telehealth: Payer: Self-pay | Admitting: Internal Medicine

## 2022-01-31 DIAGNOSIS — I480 Paroxysmal atrial fibrillation: Secondary | ICD-10-CM | POA: Diagnosis not present

## 2022-01-31 NOTE — Telephone Encounter (Signed)
Calling with abnormal Zio results. Call transferred

## 2022-01-31 NOTE — Telephone Encounter (Signed)
   Cardiac Monitor Alert  Date of alert:  01/31/2022   Patient Name: Gilbert Clark  DOB: 03/08/1936  MRN: [id number]   [place] Cardiologist: Werner Lean, MD  [facility]: DOD, Dr. Estevan Ryder  Monitor Information: Long Term Monitor [ZioXT]  Reason:  Atrial Fibrillation.  Ordering provider:  Dr. Osborne Oman.   Alert End of Service Report;  [facility] [employer] called with posted Zio Monitor report and abnormal bradycardia; 39 Bpm, 30 sec strip on pages 22-23, Strip 11.  This is the  EOS alert for this rhythm.   :1}  Date:  01/31/2022 at 1117 am  Provider:  Dr. Osborne Oman  Pt was contacted this morning, 01/31/2022 at 1130 am, and stated he felt fine / was asymptomatic.   Arrhythmia, symptoms and history reviewed with Dr. Osborne Oman.   Plan:  F/u as scheduled with [facility].    Other: Received call from [place] [facility] [employer] at 1117 am on 01/31/2022.  [name] stated they sent Pt End of Summary report, and Pt had abnormal bradycardia, 39 BPM, for 30 seconds, P22-23, on strip 11.   This was communicated to Dr. Osborne Oman on 01/31/2022 at 1140 am, and report was reviewed.  Dr. Gasper Sells stated A-Fib less than 5 seconds, Pt being asymptomatic, no AV node concerns, pt on Eliquis but not on any rate control meds;  Pt will follow up as scheduled with [facility] provider appointments, since asymptomatic.  No orders received at this time.    Varney Daily, RN  01/31/2022 12:07 PM

## 2022-02-08 ENCOUNTER — Telehealth: Payer: Self-pay

## 2022-02-08 DIAGNOSIS — I480 Paroxysmal atrial fibrillation: Secondary | ICD-10-CM

## 2022-02-08 NOTE — Telephone Encounter (Signed)
-----   Message from Werner Lean, MD sent at 02/06/2022  2:04 PM EDT ----- Asymptomatic bradycardia PAF - will attempt to coordinate with PCP; if they have a recent BMP, will get this result to make sure DOAC is dosed correctly - if unavailable, will get BMP and likely increasing to eliquis 5 mg If sx, given slow heart rates and normal LA size.

## 2022-02-08 NOTE — Telephone Encounter (Signed)
The patient has been notified of the result and verbalized understanding.  All questions (if any) were answered. Precious Gilding, RN 02/08/2022 12:18 PM

## 2022-02-09 ENCOUNTER — Ambulatory Visit: Payer: Medicare Other | Attending: Internal Medicine

## 2022-02-09 DIAGNOSIS — I480 Paroxysmal atrial fibrillation: Secondary | ICD-10-CM | POA: Diagnosis not present

## 2022-02-10 LAB — BASIC METABOLIC PANEL
BUN/Creatinine Ratio: 16 (ref 10–24)
BUN: 19 mg/dL (ref 8–27)
CO2: 26 mmol/L (ref 20–29)
Calcium: 9.9 mg/dL (ref 8.6–10.2)
Chloride: 99 mmol/L (ref 96–106)
Creatinine, Ser: 1.21 mg/dL (ref 0.76–1.27)
Glucose: 117 mg/dL — ABNORMAL HIGH (ref 70–99)
Potassium: 4.4 mmol/L (ref 3.5–5.2)
Sodium: 140 mmol/L (ref 134–144)
eGFR: 59 mL/min/{1.73_m2} — ABNORMAL LOW (ref >59)

## 2022-02-14 ENCOUNTER — Other Ambulatory Visit: Payer: Medicare Other

## 2022-03-22 ENCOUNTER — Telehealth: Payer: Self-pay

## 2022-03-22 NOTE — Telephone Encounter (Signed)
The patient has been notified of the result and verbalized understanding.  All questions (if any) were answered. Gilbert Person Saylah Ketner, RN 03/22/2022 9:54 AM   Pt reports PCP office increased Eliquis to '5mg'$  PO Bid. Updated pt medication list to reflect this change.

## 2022-03-22 NOTE — Telephone Encounter (Signed)
-----   Message from Werner Lean, MD sent at 02/12/2022  4:35 PM EDT ----- Results: Creainine is normal Plan: assuming no further falls, change dose to eliquis 5  Werner Lean, MD

## 2022-03-31 DIAGNOSIS — H26493 Other secondary cataract, bilateral: Secondary | ICD-10-CM | POA: Diagnosis not present

## 2022-03-31 DIAGNOSIS — Z961 Presence of intraocular lens: Secondary | ICD-10-CM | POA: Diagnosis not present

## 2022-04-13 DIAGNOSIS — H26491 Other secondary cataract, right eye: Secondary | ICD-10-CM | POA: Diagnosis not present

## 2022-04-27 DIAGNOSIS — H26492 Other secondary cataract, left eye: Secondary | ICD-10-CM | POA: Diagnosis not present

## 2022-05-18 DIAGNOSIS — D6859 Other primary thrombophilia: Secondary | ICD-10-CM | POA: Diagnosis not present

## 2022-05-18 DIAGNOSIS — I4891 Unspecified atrial fibrillation: Secondary | ICD-10-CM | POA: Diagnosis not present

## 2022-05-18 DIAGNOSIS — Z86711 Personal history of pulmonary embolism: Secondary | ICD-10-CM | POA: Diagnosis not present

## 2022-05-18 DIAGNOSIS — E039 Hypothyroidism, unspecified: Secondary | ICD-10-CM | POA: Diagnosis not present

## 2022-05-18 DIAGNOSIS — Z23 Encounter for immunization: Secondary | ICD-10-CM | POA: Diagnosis not present

## 2022-05-18 DIAGNOSIS — M353 Polymyalgia rheumatica: Secondary | ICD-10-CM | POA: Diagnosis not present

## 2022-05-18 DIAGNOSIS — Z79899 Other long term (current) drug therapy: Secondary | ICD-10-CM | POA: Diagnosis not present

## 2022-05-18 DIAGNOSIS — E782 Mixed hyperlipidemia: Secondary | ICD-10-CM | POA: Diagnosis not present

## 2022-05-18 DIAGNOSIS — Z8551 Personal history of malignant neoplasm of bladder: Secondary | ICD-10-CM | POA: Diagnosis not present

## 2022-05-18 DIAGNOSIS — R4701 Aphasia: Secondary | ICD-10-CM | POA: Diagnosis not present

## 2022-06-28 DIAGNOSIS — E039 Hypothyroidism, unspecified: Secondary | ICD-10-CM | POA: Diagnosis not present

## 2022-06-28 DIAGNOSIS — E782 Mixed hyperlipidemia: Secondary | ICD-10-CM | POA: Diagnosis not present

## 2022-06-28 DIAGNOSIS — G8929 Other chronic pain: Secondary | ICD-10-CM | POA: Diagnosis not present

## 2022-07-04 NOTE — Progress Notes (Unsigned)
Cardiology Office Note:    Date:  07/05/2022   ID:  Gilbert Clark, DOB [birth year], MRN [id number]  PCP:  Cari Caraway, [place] [facility] Cardiologist:  Werner Lean, MD     Referring MD: Cari Caraway, MD   CC: A fib Follow up  History of Present Illness:    Gilbert Clark is a 87 y.o. male with a hx of atrial fibrillation, Hx of provoked PE after Achilles surgery 15 years ago (had IVC filter or unclear etiology).  History of PMR without Giant Cell Arteritis. 2023: creatinine improved, we increased his DOAC as appropriate. Provider note: Has baseline aphasia not stroke related  Patient notes that he is doing fine.   Since last visit notes that he is doing well . There are no interval hospital/ED visit.    No chest pain or pressure .  No SOB/DOE and no PND/Orthopnea.  No weight gain or leg swelling.   Has rare palpitations.  Has rate controlled PAF without medications.   Past Medical History:  Diagnosis Date   Anemia    Aphasia    Atrial fibrillation ([place])    Benign prostatic hyperplasia with lower urinary tract symptoms    Bladder cancer (HCC)    BPH (benign prostatic hyperplasia)    Constipation    Dementia (H[facility])    Fibromyalgia    GERD (gastroesophageal reflux disease)    Headache    High risk medication use    History of adenomatous polyp of colon    Hx of blood clots    Hyperlipidemia    Hypothyroidism    Memory loss    Polymyalgia rheumatica ([place])    Pulmonary embolism ([place])    hx of     Past Surgical History:  Procedure Laterality Date   ACHILLES TENDON REPAIR  2004   bilateral cataract surgery   2020   CYSTOSCOPY W/ RETROGRADES Bilateral 01/21/2020   Procedure: CYSTOSCOPY WITH BILATERAL RETROGRADE;  Surgeon: Irine Seal, MD;  Location: [facility];  Service: Urology;  Laterality: Bilateral;   CYSTOSCOPY WITH URETEROSCOPY AND STENT PLACEMENT Right 06/25/2021   Procedure: URETEROSCOPY AND STENT  PLACEMENT;  Surgeon: Irine Seal, MD;  Location: [facility];  Service: Urology;  Laterality: Right;   IVC FILTER INSERTION      2000   TRANSURETHRAL RESECTION OF BLADDER TUMOR WITH GYRUS (TURBT-GYRUS)     multiple times    TRANSURETHRAL RESECTION OF BLADDER TUMOR WITH MITOMYCIN-C N/A 01/21/2020   Procedure: TRANSURETHRAL RESECTION OF BLADDER TUMOR WITH GEMCITABINE;  Surgeon: Irine Seal, MD;  Location: [facility];  Service: Urology;  Laterality: N/A;   TRANSURETHRAL RESECTION OF BLADDER TUMOR WITH MITOMYCIN-C Bilateral 06/25/2021   Procedure: CYSTOSCOPY BILATERAL RETROGRADES TRANSURETHRAL RESECTION OF BLADDER TUMOR;  Surgeon: Irine Seal, MD;  Location: [facility];  Service: Urology;  Laterality: Bilateral;    Current Medications: Current Meds  Medication Sig   acetaminophen (TYLENOL) 500 MG tablet Take 500 mg by mouth every 6 (six) hours as needed for mild pain.   amLODipine (NORVASC) 2.5 MG tablet daily.   apixaban (ELIQUIS) 5 MG TABS tablet Take 5 mg by mouth 2 (two) times daily.   augmented betamethasone dipropionate (DIPROLENE-AF) 0.05 % cream Apply 1 application topically 2 (two) times daily.   cyanocobalamin (VITAMIN B12) 1000 MCG tablet daily.   diclofenac Sodium (VOLTAREN) 1 % GEL Apply 4 g topically every 6 (six) hours as needed (pain).   finasteride (PROSCAR) 5 MG tablet  Take 5 mg by mouth daily.   lactose free nutrition (BOOST) LIQD Take 237 mLs by mouth daily.   levothyroxine (SYNTHROID) 137 MCG tablet Take 137 mcg by mouth daily before breakfast.   Omega-3 Fatty Acids (FISH OIL) 1000 MG CPDR Take 2 capsules by mouth 2 (two) times daily. 2- two times daily   OVER THE COUNTER MEDICATION Take 1 tablet by mouth daily. Vegan multivitamin - one daily   polyethylene glycol powder (GLYCOLAX/MIRALAX) 17 GM/SCOOP powder Take 8 g by mouth daily.   Prenatal Vit-Fe Fumarate-FA (MULTIVITAMIN-PRENATAL) 27-0.8 MG TABS tablet Take 1 tablet by mouth daily at 12 noon.   tamsulosin  (FLOMAX) 0.4 MG CAPS capsule Take 0.4 mg by mouth 2 (two) times daily.   TURMERIC PO Take 500 mg by mouth daily.   vitamin C (ASCORBIC ACID) 500 MG tablet Take 500 mg by mouth 2 (two) times daily. 500 mg 2 times daily   Vitamin D, Cholecalciferol, 10 MCG (400 UNIT) TABS Take 400 Units by mouth daily.     Allergies:   Augmentin [amoxicillin-pot clavulanate], Epinephrine, and Sulfa antibiotics   Social History   Socioeconomic History   Marital status: Widowed    Spouse name: Not on file   Number of children: 2   Years of education: 14   Highest education level: Some college, no degree  Occupational History    Comment: retired from [employer], [employer]  Tobacco Use   Smoking status: Former    Types: Cigarettes    Quit date: 04/18/1962    Years since quitting: 60.2   Smokeless tobacco: Never  Vaping Use   Vaping Use: Never used  Substance and Sexual Activity   Alcohol use: Not Currently    Comment: 05/27/20 1 beer weekly   Drug use: Never   Sexual activity: Not on file  Other Topics Concern   Not on file  Social History Narrative   05/27/20 lives alone   Social Determinants of Health   Financial Resource Strain: Not on file  Food Insecurity: Not on file  Transportation Needs: Not on file  Physical Activity: Not on file  Stress: Not on file  Social Connections: Not on file     Family History: The patient's family history includes Appendicitis in his father; Breast cancer in his sister.  ROS:   Please see the history of present illness.    All other systems reviewed and are negative.  EKGs/Labs/Other Studies Reviewed:    The following studies were reviewed today:  EKG:  EKG is  ordered today.  The ekg ordered today demonstrates  11/26/21: atrial fibrillation LBBB rate 66 01/03/22: Sinus bradycardia LBBB 07/05/2022: Sinus rhythm with 1st HB and LBBB   Cardiac Studies & Procedures       ECHOCARDIOGRAM  ECHOCARDIOGRAM COMPLETE 01/13/2022  Narrative ECHOCARDIOGRAM  REPORT    Patient Name:   Gilbert Clark Date of Exam: 01/13/2022 Medical Rec #:  [id number]            Height:       72.0 in Accession #:    [id number]           Weight:       203.0 lb Date of Birth:  May 26, 1935            BSA:          2.144 m Patient Age:    87 years             BP:  162/88 mmHg Patient Gender: M                    HR:           59 bpm. Exam Location:  [place]  Procedure: 2D Echo, 3D Echo, Cardiac Doppler, Color Doppler and Strain Analysis  Indications:    I48 Atrial fibrillation  History:        Patient has no prior history of Echocardiogram examinations. Arrythmias:Atrial Fibrillation and LBBB; Risk Factors:Former Smoker. Pulmonary embolus.  Sonographer:    Basilia Jumbo [facility] RDCS Referring Phys: [id number] [facility]  IMPRESSIONS   1. Left ventricular ejection fraction, by estimation, is 50 to 55%. The left ventricle has low normal function. The left ventricle has no regional wall motion abnormalities. Left ventricular diastolic parameters are consistent with Grade I diastolic dysfunction (impaired relaxation). The average left ventricular global longitudinal strain is -22.3 %. The global longitudinal strain is normal. 2. Right ventricular systolic function is normal. The right ventricular size is normal. Tricuspid regurgitation signal is inadequate for assessing PA pressure. 3. The mitral valve is normal in structure. No evidence of mitral valve regurgitation. No evidence of mitral stenosis. Moderate mitral annular calcification. 4. The aortic valve is tricuspid. Aortic valve regurgitation is not visualized. Aortic valve sclerosis is present, with no evidence of aortic valve stenosis. 5. The inferior vena cava is normal in size with greater than 50% respiratory variability, suggesting right atrial pressure of 3 mmHg.  Comparison(s): No prior Echocardiogram.  FINDINGS Left Ventricle: Left ventricular ejection fraction, by estimation,  is 50 to 55%. The left ventricle has low normal function. The left ventricle has no regional wall motion abnormalities. The average left ventricular global longitudinal strain is -22.3 %. The global longitudinal strain is normal. The left ventricular internal cavity size was normal in size. There is no left ventricular hypertrophy. Abnormal (paradoxical) septal motion, consistent with left bundle branch block. Left ventricular diastolic parameters are consistent with Grade I diastolic dysfunction (impaired relaxation).  Right Ventricle: The right ventricular size is normal. Right ventricular systolic function is normal. Tricuspid regurgitation signal is inadequate for assessing PA pressure. The tricuspid regurgitant velocity is 2.23 m/s, and with an assumed right atrial pressure of 8 mmHg, the estimated right ventricular systolic pressure is [id number] mmHg.  Left Atrium: Left atrial size was normal in size.  Right Atrium: Right atrial size was normal in size.  Pericardium: Trivial pericardial effusion is present.  Mitral Valve: The mitral valve is normal in structure. There is mild calcification of the mitral valve leaflet(s). Moderate mitral annular calcification. No evidence of mitral valve regurgitation. No evidence of mitral valve stenosis.  Tricuspid Valve: The tricuspid valve is normal in structure. Tricuspid valve regurgitation is trivial. No evidence of tricuspid stenosis.  Aortic Valve: The aortic valve is tricuspid. Aortic valve regurgitation is not visualized. Aortic valve sclerosis is present, with no evidence of aortic valve stenosis.  Pulmonic Valve: The pulmonic valve was normal in structure. Pulmonic valve regurgitation is not visualized. No evidence of pulmonic stenosis.  Aorta: The aortic root is normal in size and structure.  Venous: The inferior vena cava is normal in size with greater than 50% respiratory variability, suggesting right atrial pressure of 3 mmHg.  IAS/Shunts:  No atrial level shunt detected by color flow Doppler.   LEFT VENTRICLE PLAX 2D LVIDd:         5.40 cm   Diastology LVIDs:  4.00 cm   LV e' medial:    6.53 cm/s LV PW:         0.90 cm   LV E/e' medial:  7.6 LV IVS:        0.80 cm   LV e' lateral:   5.57 cm/s LVOT diam:     2.30 cm   LV E/e' lateral: 8.9 LV SV:         95 LV SV Index:   44        2D Longitudinal Strain LVOT Area:     4.15 cm  2D Strain GLS (A2C):   -20.8 % 2D Strain GLS (A3C):   -23.2 % 2D Strain GLS (A4C):   -23.0 % 2D Strain GLS Avg:     -22.3 %  3D Volume EF: 3D EF:        57 % LV EDV:       158 ml LV ESV:       68 ml LV SV:        91 ml  RIGHT VENTRICLE            IVC RV Basal diam:  3.30 cm    IVC diam: 2.00 cm RV S prime:     9.96 cm/s TAPSE (M-mode): 1.9 cm RVSP:           27.9 mmHg  LEFT ATRIUM             Index        RIGHT ATRIUM           Index LA diam:        3.90 cm 1.82 cm/m   RA Pressure: 8.00 mmHg LA Vol (A2C):   61.3 ml 28.59 ml/m  RA Area:     11.70 cm LA Vol (A4C):   29.5 ml 13.76 ml/m  RA Volume:   26.40 ml  12.31 ml/m LA Biplane Vol: 46.3 ml 21.59 ml/m AORTIC VALVE LVOT Vmax:   105.00 cm/s LVOT Vmean:  70.200 cm/s LVOT VTI:    0.229 m  AORTA Ao Root diam: 3.60 cm Ao Asc diam:  3.30 cm  MITRAL VALVE               TRICUSPID VALVE TR Peak grad:   19.9 mmHg MV Decel Time: 296 msec    TR Vmax:        223.00 cm/s MV E velocity: 49.70 cm/s  Estimated RAP:  8.00 mmHg MV A velocity: 91.10 cm/s  RVSP:           27.9 mmHg MV E/A ratio:  0.55 SHUNTS Systemic VTI:  0.23 m Systemic Diam: 2.30 cm  Kirk Ruths MD Electronically signed by Kirk Ruths MD Signature Date/Time: 01/13/2022/11:17:52 AM    Final    MONITORS  LONG TERM MONITOR (3-14 DAYS) 02/06/2022  Narrative   Patient had a minimum heart rate of 31 bpm, maximum heart rate of 151 bpm, and average heart rate of 64 bpm.   Predominant underlying rhythm was sinus rhythm.   Atrial fibrillation with 7%  burden, average rate of 67 bpm, longest episode nearing 4 hours.   Isolated PACs were occasional (4%).   Isolated PVCs were rare (<1.0%).   Triggered and diary events associated with sinus rhythm, sinus bradycardia, and atrial fibrillation.  Potentially symptomatic atrial fibrillation.            Recent Labs: 02/09/2022: BUN 19; Creatinine, Ser 1.21; Potassium 4.4; Sodium 140  Recent Lipid Panel No  results found for: "CHOL", "TRIG", "HDL", "CHOLHDL", "VLDL", "LDLCALC", "LDLDIRECT"       Physical Exam:    VS:  BP 110/60   Pulse 67   Ht 6' (1.829 m)   Wt 206 lb 3.2 oz (93.5 kg)   SpO2 98%   BMI 27.97 kg/m     Wt Readings from Last 3 Encounters:  07/05/22 206 lb 3.2 oz (93.5 kg)  01/03/22 203 lb (92.1 kg)  06/25/21 201 lb (91.2 kg)    Gen: no distress   Ears: Bilateral Pilar Plate Sign Cardiac: No Rubs or Gallops, no murmur, RRR +2 radial pulses Respiratory: Clear to auscultation bilaterally, normal effort, normal  respiratory rate GI: Soft, nontender, non-distended  MS: +1 bilateral edema;  moves all extremities Integument: Skin feels warm Neuro:  At time of evaluation, alert and oriented to person/place/time/situation  Psych: Normal affect, patient feels well  ASSESSMENT:    1. PAF (paroxysmal atrial fibrillation) ([place])   2. LBBB (left bundle branch block)   3. History of aphasia   4. Acquired thrombophilia ([place])   5. Lower extremity edema     PLAN:    Paroxsymal Atrial Fibrillation - with history of PE (provoked IVC filter placed 15 year ago) Hypothyroidism- as per PCP HTN - Risk factors include Age X2 - CHADSVASC=3. -  Acquired Thrombophilia; Eliquis 5 mg  LE edema CKD NOS with last creatinine 1.4 - BNP and BMP today - may add low dose lasix and BMP in 1-2 weeks  LBBB with 1st HB - Monitor  One year with me or our team        Medication Adjustments/Labs and Tests Ordered: Current medicines are reviewed at length with the patient today.  Concerns  regarding medicines are outlined above.  Orders Placed This Encounter  Procedures   Pro b natriuretic peptide (BNP)   EKG 12-Lead   No orders of the defined types were placed in this encounter.   Patient Instructions  Medication Instructions:  Your physician recommends that you continue on your current medications as directed. Please refer to the Current Medication list given to you today.  *If you need a refill on your cardiac medications before your next appointment, please call your pharmacy*   Lab Work: TODAY: BMP, BNP  If you have labs (blood work) drawn today and your tests are completely normal, you will receive your results only by: [place] (if you have MyChart) OR A paper copy in the mail If you have any lab test that is abnormal or we need to change your treatment, we will call you to review the results.   Testing/Procedures: NONE   Follow-Up: At [facility], you and your health needs are our priority.  As part of our continuing mission to provide you with exceptional heart care, we have created designated Provider Care Teams.  These Care Teams include your primary Cardiologist (physician) and Advanced Practice Providers (APPs -  Physician Assistants and Nurse Practitioners) who all work together to provide you with the care you need, when you need it.   Your next appointment:   1 year(s)  Provider:   Werner Lean, MD       Signed, Werner Lean, MD  07/05/2022 9:21 AM    [place]

## 2022-07-05 ENCOUNTER — Encounter: Payer: Self-pay | Admitting: Internal Medicine

## 2022-07-05 ENCOUNTER — Ambulatory Visit: Payer: Medicare Other | Attending: Internal Medicine | Admitting: Internal Medicine

## 2022-07-05 VITALS — BP 110/60 | HR 67 | Ht 72.0 in | Wt 206.2 lb

## 2022-07-05 DIAGNOSIS — I447 Left bundle-branch block, unspecified: Secondary | ICD-10-CM

## 2022-07-05 DIAGNOSIS — R600 Localized edema: Secondary | ICD-10-CM | POA: Diagnosis not present

## 2022-07-05 DIAGNOSIS — I480 Paroxysmal atrial fibrillation: Secondary | ICD-10-CM

## 2022-07-05 DIAGNOSIS — D6869 Other thrombophilia: Secondary | ICD-10-CM

## 2022-07-05 DIAGNOSIS — Z87898 Personal history of other specified conditions: Secondary | ICD-10-CM

## 2022-07-05 NOTE — Patient Instructions (Signed)
Medication Instructions:  Your physician recommends that you continue on your current medications as directed. Please refer to the Current Medication list given to you today.  *If you need a refill on your cardiac medications before your next appointment, please call your pharmacy*   Lab Work: TODAY: BMP, BNP  If you have labs (blood work) drawn today and your tests are completely normal, you will receive your results only by: [place] (if you have MyChart) OR A paper copy in the mail If you have any lab test that is abnormal or we need to change your treatment, we will call you to review the results.   Testing/Procedures: NONE   Follow-Up: At [facility], you and your health needs are our priority.  As part of our continuing mission to provide you with exceptional heart care, we have created designated Provider Care Teams.  These Care Teams include your primary Cardiologist (physician) and Advanced Practice Providers (APPs -  Physician Assistants and Nurse Practitioners) who all work together to provide you with the care you need, when you need it.   Your next appointment:   1 year(s)  Provider:   Werner Lean, MD

## 2022-07-05 NOTE — Addendum Note (Signed)
Addended by: Precious Gilding on: 07/05/2022 09:29 AM   Modules accepted: Orders

## 2022-07-06 LAB — BASIC METABOLIC PANEL
BUN/Creatinine Ratio: 15 (ref 10–24)
BUN: 19 mg/dL (ref 8–27)
CO2: 24 mmol/L (ref 20–29)
Calcium: 10 mg/dL (ref 8.6–10.2)
Chloride: 103 mmol/L (ref 96–106)
Creatinine, Ser: 1.29 mg/dL — ABNORMAL HIGH (ref 0.76–1.27)
Glucose: 96 mg/dL (ref 70–99)
Potassium: 4.5 mmol/L (ref 3.5–5.2)
Sodium: 142 mmol/L (ref 134–144)
eGFR: 54 mL/min/{1.73_m2} — ABNORMAL LOW (ref >59)

## 2022-07-06 LAB — PRO B NATRIURETIC PEPTIDE: NT-Pro BNP: 151 pg/mL (ref 0–486)

## 2022-07-08 ENCOUNTER — Telehealth: Payer: Self-pay

## 2022-07-08 DIAGNOSIS — R600 Localized edema: Secondary | ICD-10-CM

## 2022-07-08 MED ORDER — FUROSEMIDE 20 MG PO TABS: 20.0000 mg | ORAL_TABLET | Freq: Every day | ORAL | 3 refills | Status: DC

## 2022-07-08 NOTE — Telephone Encounter (Signed)
-----   Message from Werner Lean, MD sent at 07/06/2022  8:44 AM EDT ----- Results: Stable creatinine and normal BNP Plan: Lasix 20 mg PO daily and labs in 1-2 weeks  Werner Lean, MD

## 2022-07-08 NOTE — Telephone Encounter (Signed)
The patient has been notified of the result and verbalized understanding.  All questions (if any) were answered. Precious Gilding, RN 07/08/2022 1:35 PM   Reviewed instructions with pt will send out appointment reminder for f/u labs.  Called pt daughter Corene Cornea ok per DPR left a message with MD medication instructions.  Advised to call the office with any questions or concerns.

## 2022-07-11 DIAGNOSIS — R3912 Poor urinary stream: Secondary | ICD-10-CM | POA: Diagnosis not present

## 2022-07-11 DIAGNOSIS — Z8551 Personal history of malignant neoplasm of bladder: Secondary | ICD-10-CM | POA: Diagnosis not present

## 2022-07-11 DIAGNOSIS — R3914 Feeling of incomplete bladder emptying: Secondary | ICD-10-CM | POA: Diagnosis not present

## 2022-07-25 ENCOUNTER — Ambulatory Visit: Payer: Medicare Other | Attending: Internal Medicine

## 2022-07-25 DIAGNOSIS — R600 Localized edema: Secondary | ICD-10-CM

## 2022-07-26 ENCOUNTER — Telehealth: Payer: Self-pay

## 2022-07-26 LAB — BASIC METABOLIC PANEL
BUN/Creatinine Ratio: 12 (ref 10–24)
BUN: 17 mg/dL (ref 8–27)
CO2: 24 mmol/L (ref 20–29)
Calcium: 9.9 mg/dL (ref 8.6–10.2)
Chloride: 103 mmol/L (ref 96–106)
Creatinine, Ser: 1.41 mg/dL — ABNORMAL HIGH (ref 0.76–1.27)
Glucose: 91 mg/dL (ref 70–99)
Potassium: 4 mmol/L (ref 3.5–5.2)
Sodium: 142 mmol/L (ref 134–144)
eGFR: 49 mL/min/{1.73_m2} — ABNORMAL LOW (ref >59)

## 2022-07-26 NOTE — Telephone Encounter (Signed)
The patient has been notified of the result and verbalized understanding.  All questions (if any) were answered. Arvid Right Cosby Proby, RN 07/26/2022 1:43 PM   Pt reports legs look about the same.  Feels that issue may be that needs an injection in right knee. Had shot before and helped with swelling.  Reports will talk with PCP at next OV about this issue.

## 2022-07-26 NOTE — Telephone Encounter (Signed)
-----   Message from Christell Constant, MD sent at 07/26/2022  8:36 AM EDT ----- Results: Stable creatinine (has been 1.2-1.4 from past records) Plan: No change in lasix  Christell Constant, MD

## 2022-08-22 DIAGNOSIS — Z79899 Other long term (current) drug therapy: Secondary | ICD-10-CM | POA: Diagnosis not present

## 2022-08-22 DIAGNOSIS — E039 Hypothyroidism, unspecified: Secondary | ICD-10-CM | POA: Diagnosis not present

## 2022-11-16 DIAGNOSIS — I4891 Unspecified atrial fibrillation: Secondary | ICD-10-CM | POA: Diagnosis not present

## 2022-11-16 DIAGNOSIS — Z8551 Personal history of malignant neoplasm of bladder: Secondary | ICD-10-CM | POA: Diagnosis not present

## 2022-11-16 DIAGNOSIS — N1831 Chronic kidney disease, stage 3a: Secondary | ICD-10-CM | POA: Diagnosis not present

## 2022-11-16 DIAGNOSIS — R4701 Aphasia: Secondary | ICD-10-CM | POA: Diagnosis not present

## 2022-11-16 DIAGNOSIS — D6859 Other primary thrombophilia: Secondary | ICD-10-CM | POA: Diagnosis not present

## 2022-11-16 DIAGNOSIS — D696 Thrombocytopenia, unspecified: Secondary | ICD-10-CM | POA: Diagnosis not present

## 2022-11-16 DIAGNOSIS — Z86711 Personal history of pulmonary embolism: Secondary | ICD-10-CM | POA: Diagnosis not present

## 2022-11-18 DIAGNOSIS — N1832 Chronic kidney disease, stage 3b: Secondary | ICD-10-CM | POA: Diagnosis not present

## 2022-11-21 DIAGNOSIS — Z0000 Encounter for general adult medical examination without abnormal findings: Secondary | ICD-10-CM | POA: Diagnosis not present

## 2023-02-07 DIAGNOSIS — R3912 Poor urinary stream: Secondary | ICD-10-CM | POA: Diagnosis not present

## 2023-02-07 DIAGNOSIS — R3914 Feeling of incomplete bladder emptying: Secondary | ICD-10-CM | POA: Diagnosis not present

## 2023-02-07 DIAGNOSIS — R350 Frequency of micturition: Secondary | ICD-10-CM | POA: Diagnosis not present

## 2023-02-12 DIAGNOSIS — S00512A Abrasion of oral cavity, initial encounter: Secondary | ICD-10-CM | POA: Diagnosis not present

## 2023-02-12 DIAGNOSIS — L309 Dermatitis, unspecified: Secondary | ICD-10-CM | POA: Diagnosis not present

## 2023-02-17 DIAGNOSIS — N1831 Chronic kidney disease, stage 3a: Secondary | ICD-10-CM | POA: Diagnosis not present

## 2023-02-17 DIAGNOSIS — E039 Hypothyroidism, unspecified: Secondary | ICD-10-CM | POA: Diagnosis not present

## 2023-02-17 DIAGNOSIS — Z86711 Personal history of pulmonary embolism: Secondary | ICD-10-CM | POA: Diagnosis not present

## 2023-02-17 DIAGNOSIS — E782 Mixed hyperlipidemia: Secondary | ICD-10-CM | POA: Diagnosis not present

## 2023-02-21 DIAGNOSIS — R4701 Aphasia: Secondary | ICD-10-CM | POA: Diagnosis not present

## 2023-02-21 DIAGNOSIS — Z8551 Personal history of malignant neoplasm of bladder: Secondary | ICD-10-CM | POA: Diagnosis not present

## 2023-02-21 DIAGNOSIS — E782 Mixed hyperlipidemia: Secondary | ICD-10-CM | POA: Diagnosis not present

## 2023-02-21 DIAGNOSIS — E039 Hypothyroidism, unspecified: Secondary | ICD-10-CM | POA: Diagnosis not present

## 2023-02-21 DIAGNOSIS — Z86711 Personal history of pulmonary embolism: Secondary | ICD-10-CM | POA: Diagnosis not present

## 2023-02-21 DIAGNOSIS — R809 Proteinuria, unspecified: Secondary | ICD-10-CM | POA: Diagnosis not present

## 2023-02-21 DIAGNOSIS — I4891 Unspecified atrial fibrillation: Secondary | ICD-10-CM | POA: Diagnosis not present

## 2023-02-21 DIAGNOSIS — I10 Essential (primary) hypertension: Secondary | ICD-10-CM | POA: Diagnosis not present

## 2023-02-21 DIAGNOSIS — D649 Anemia, unspecified: Secondary | ICD-10-CM | POA: Diagnosis not present

## 2023-02-21 DIAGNOSIS — N1831 Chronic kidney disease, stage 3a: Secondary | ICD-10-CM | POA: Diagnosis not present

## 2023-03-13 DIAGNOSIS — C674 Malignant neoplasm of posterior wall of bladder: Secondary | ICD-10-CM | POA: Diagnosis not present

## 2023-03-13 DIAGNOSIS — R3914 Feeling of incomplete bladder emptying: Secondary | ICD-10-CM | POA: Diagnosis not present

## 2023-03-14 ENCOUNTER — Other Ambulatory Visit: Payer: Self-pay | Admitting: Urology

## 2023-03-14 ENCOUNTER — Telehealth: Payer: Self-pay | Admitting: Internal Medicine

## 2023-03-14 NOTE — Telephone Encounter (Signed)
   Pre-operative Risk Assessment    Patient Name: Gilbert Clark  DOB: Dec 25, 1935 MRN: [id number]      Request for Surgical Clearance    Procedure:  TUR Bladder Tumor  Date of Surgery:  Clearance 04-21-23                                 Surgeon:  Dr Bjorn Pippin Surgeon's Group or Practice Name:   Phone number:  [phone] Fax number:  [phone]   Type of Clearance Requested:   - Medical  - Pharmacy:  Hold Apixaban (Eliquis)     Type of Anesthesia:  General    Additional requests/questions:    Rivka Safer   03/14/2023, 3:12 PM

## 2023-03-15 ENCOUNTER — Telehealth: Payer: Self-pay

## 2023-03-15 NOTE — Telephone Encounter (Signed)
   Name: Gilbert Clark  DOB: Mar 16, 1936  MRN: [id number]  Primary Cardiologist: Christell Constant, MD   Preoperative team, please contact this patient and set up a phone call appointment for further preoperative risk assessment. Please obtain consent and complete medication review. Thank you for your help.  I confirm that guidance regarding antiplatelet and oral anticoagulation therapy has been completed and, if necessary, noted below.  Hx of provoked PE after Achilles surgery 15 years ago (had IVC filter or unclear etiology)    CrCl 45 ml/min   Per office protocol, patient can hold Eliquis for 2-3 days prior to procedure.    I also confirmed the patient resides in the state of [place]. As per [facility] Medical Board telemedicine laws, the patient must reside in the state in which the provider is licensed.   Ronney Asters, NP 03/15/2023, 1:03 PM [facility]

## 2023-03-15 NOTE — Telephone Encounter (Signed)
Patient with diagnosis of afib on Eliquis for anticoagulation.    Procedure: TUR Bladder Tumor  Date of procedure: 04/21/23   CHA2DS2-VASc Score = 3   This indicates a 3.2% annual risk of stroke. The patient's score is based upon: CHF History: 0 HTN History: 0 Diabetes History: 0 Stroke History: 0 Vascular Disease History: 1 Age Score: 2 Gender Score: 0      Hx of provoked PE after Achilles surgery 15 years ago (had IVC filter or unclear etiology)   CrCl 45 ml/min  Per office protocol, patient can hold Eliquis for 2-3 days prior to procedure.    **This guidance is not considered finalized until pre-operative APP has relayed final recommendations.**

## 2023-03-15 NOTE — Telephone Encounter (Signed)
PATIENT IS AGREEABLE WITH TELEHEALTH VISIT AND REQUESTED I CONTACT HIS DAUGHTER TO ASSIST WITH ADDITIONAL REQUESTS.   SPOKE WITH THE PATIENTS DAUGHTER WHO STATES THE PATIENT IS STARTING TO HAVE COMMUNICATION ISSUES AND PREFERS SHE BE CONTACTED.

## 2023-03-15 NOTE — Telephone Encounter (Signed)
  Patient Consent for Virtual Visit         Gilbert Clark has provided verbal consent on 03/15/2023 for a virtual visit (video or telephone).   CONSENT FOR VIRTUAL VISIT FOR:  Gilbert Clark  By participating in this virtual visit I agree to the following:  I hereby voluntarily request, consent and authorize [facility] and its employed or contracted physicians, physician assistants, nurse practitioners or other licensed health care professionals (the Practitioner), to provide me with telemedicine health care services (the "Services") as deemed necessary by the treating Practitioner. I acknowledge and consent to receive the Services by the Practitioner via telemedicine. I understand that the telemedicine visit will involve communicating with the Practitioner through live audiovisual communication technology and the disclosure of certain medical information by electronic transmission. I acknowledge that I have been given the opportunity to request an in-person assessment or other available alternative prior to the telemedicine visit and am voluntarily participating in the telemedicine visit.  I understand that I have the right to withhold or withdraw my consent to the use of telemedicine in the course of my care at any time, without affecting my right to future care or treatment, and that the Practitioner or I may terminate the telemedicine visit at any time. I understand that I have the right to inspect all information obtained and/or recorded in the course of the telemedicine visit and may receive copies of available information for a reasonable fee.  I understand that some of the potential risks of receiving the Services via telemedicine include:  Delay or interruption in medical evaluation due to technological equipment failure or disruption; Information transmitted may not be sufficient (e.g. poor resolution of images) to allow for appropriate medical decision making by the  Practitioner; and/or  In rare instances, security protocols could fail, causing a breach of personal health information.  Furthermore, I acknowledge that it is my responsibility to provide information about my medical history, conditions and care that is complete and accurate to the best of my ability. I acknowledge that Practitioner's advice, recommendations, and/or decision may be based on factors not within their control, such as incomplete or inaccurate data provided by me or distortions of diagnostic images or specimens that may result from electronic transmissions. I understand that the practice of medicine is not an exact science and that Practitioner makes no warranties or guarantees regarding treatment outcomes. I acknowledge that a copy of this consent can be made available to me via my patient portal [facility] MyChart), or I can request a printed copy by calling the office of [facility].    I understand that my insurance will be billed for this visit.   I have read or had this consent read to me. I understand the contents of this consent, which adequately explains the benefits and risks of the Services being provided via telemedicine.  I have been provided ample opportunity to ask questions regarding this consent and the Services and have had my questions answered to my satisfaction. I give my informed consent for the services to be provided through the use of telemedicine in my medical care   .

## 2023-03-20 DIAGNOSIS — Z23 Encounter for immunization: Secondary | ICD-10-CM | POA: Diagnosis not present

## 2023-03-20 DIAGNOSIS — D649 Anemia, unspecified: Secondary | ICD-10-CM | POA: Diagnosis not present

## 2023-03-20 DIAGNOSIS — N1831 Chronic kidney disease, stage 3a: Secondary | ICD-10-CM | POA: Diagnosis not present

## 2023-03-20 DIAGNOSIS — I10 Essential (primary) hypertension: Secondary | ICD-10-CM | POA: Diagnosis not present

## 2023-03-20 DIAGNOSIS — E782 Mixed hyperlipidemia: Secondary | ICD-10-CM | POA: Diagnosis not present

## 2023-03-20 DIAGNOSIS — C679 Malignant neoplasm of bladder, unspecified: Secondary | ICD-10-CM | POA: Diagnosis not present

## 2023-03-20 DIAGNOSIS — E039 Hypothyroidism, unspecified: Secondary | ICD-10-CM | POA: Diagnosis not present

## 2023-03-20 DIAGNOSIS — L309 Dermatitis, unspecified: Secondary | ICD-10-CM | POA: Diagnosis not present

## 2023-03-20 DIAGNOSIS — I4891 Unspecified atrial fibrillation: Secondary | ICD-10-CM | POA: Diagnosis not present

## 2023-04-06 ENCOUNTER — Telehealth: Payer: Self-pay | Admitting: Nurse Practitioner

## 2023-04-06 ENCOUNTER — Ambulatory Visit (INDEPENDENT_AMBULATORY_CARE_PROVIDER_SITE_OTHER): Payer: Medicare Other

## 2023-04-06 DIAGNOSIS — Z01810 Encounter for preprocedural cardiovascular examination: Secondary | ICD-10-CM

## 2023-04-06 NOTE — Telephone Encounter (Signed)
Patient contacted for scheduled preoperative clearance visit at 2:40 PM however patient not available at that time.  Detailed message was left instructing patient to call us at his earliest convenience.  Robin Searing, NP

## 2023-04-06 NOTE — Progress Notes (Signed)
Virtual Visit via Telephone Note   Because of Gilbert Clark's co-morbid illnesses, he is at least at moderate risk for complications without adequate follow up.  This format is felt to be most appropriate for this patient at this time.  The patient did not have access to video technology/had technical difficulties with video requiring transitioning to audio format only (telephone).  All issues noted in this document were discussed and addressed.  No physical exam could be performed with this format.  Please refer to the patient's chart for his consent to telehealth for [facility].  Evaluation Performed:  Preoperative cardiovascular risk assessment _____________   Date:  04/06/2023   Patient ID:  Gilbert Clark, DOB November 24, 1935, MRN [id number] Patient Location:  Home Provider location:   Office  Primary Care Provider:  Gweneth Dimitri, MD Primary Cardiologist:  Christell Constant, MD  Chief Complaint / Patient Profile   87 y.o. y/o male with a h/o AF, LBBB, PE, hypothyroidism who is pending TURBT bladder tumor and presents today for telephonic preoperative cardiovascular risk assessment.  History of Present Illness    Gilbert Clark is a 87 y.o. male who presents via audio/video conferencing for a telehealth visit today.  Pt was last seen in cardiology clinic on 07/05/2022 by Dr. Raynelle Jan.  At that time Gilbert Clark was doing well with no new cardiac complaints.  He was rate controlled in AF.  The patient is now pending procedure as outlined above. Since his last visit, he   Patient will need to reschedule visit due to history of aphasia.  I will no charge for today's visit and route message to preop call back to schedule with patient's daughter Gilbert Clark  Per office protocol, patient can hold Eliquis for 2-3 days prior to procedure.   Past Medical History    Past Medical History:  Diagnosis Date   Anemia    Aphasia    Atrial fibrillation  (HCC)    Benign prostatic hyperplasia with lower urinary tract symptoms    Bladder cancer (HCC)    BPH (benign prostatic hyperplasia)    Constipation    Dementia (HCC)    Fibromyalgia    GERD (gastroesophageal reflux disease)    Headache    High risk medication use    History of adenomatous polyp of colon    Hx of blood clots    Hyperlipidemia    Hypothyroidism    Memory loss    Polymyalgia rheumatica (HCC)    Pulmonary embolism (HCC)    hx of    Past Surgical History:  Procedure Laterality Date   ACHILLES TENDON REPAIR  2004   bilateral cataract surgery   2020   CYSTOSCOPY W/ RETROGRADES Bilateral 01/21/2020   Procedure: CYSTOSCOPY WITH BILATERAL RETROGRADE;  Surgeon: Bjorn Pippin, MD;  Location: [facility];  Service: Urology;  Laterality: Bilateral;   CYSTOSCOPY WITH URETEROSCOPY AND STENT PLACEMENT Right 06/25/2021   Procedure: URETEROSCOPY AND STENT PLACEMENT;  Surgeon: Bjorn Pippin, MD;  Location: [facility];  Service: Urology;  Laterality: Right;   IVC FILTER INSERTION      2000   TRANSURETHRAL RESECTION OF BLADDER TUMOR WITH GYRUS (TURBT-GYRUS)     multiple times    TRANSURETHRAL RESECTION OF BLADDER TUMOR WITH MITOMYCIN-C N/A 01/21/2020   Procedure: TRANSURETHRAL RESECTION OF BLADDER TUMOR WITH GEMCITABINE;  Surgeon: Bjorn Pippin, MD;  Location: [facility];  Service: Urology;  Laterality: N/A;   TRANSURETHRAL RESECTION OF BLADDER TUMOR WITH  MITOMYCIN-C Bilateral 06/25/2021   Procedure: CYSTOSCOPY BILATERAL RETROGRADES TRANSURETHRAL RESECTION OF BLADDER TUMOR;  Surgeon: Bjorn Pippin, MD;  Location: [facility];  Service: Urology;  Laterality: Bilateral;    Allergies  Allergies  Allergen Reactions   Augmentin [Amoxicillin-Pot Clavulanate]     On list due to father was allergic    Epinephrine     On list because father was allergic    Sulfa Antibiotics     Unknown  On list because father was allergic     Home Medications    Prior to Admission  medications   Medication Sig Start Date End Date Taking? Authorizing Provider  acetaminophen (TYLENOL) 500 MG tablet Take 500 mg by mouth every 6 (six) hours as needed for mild pain.    [provider]  amLODipine (NORVASC) 2.5 MG tablet Take 2.5 mg by mouth daily.    [provider]  apixaban (ELIQUIS) 5 MG TABS tablet Take 5 mg by mouth 2 (two) times daily.    [provider]  cyanocobalamin (VITAMIN B12) 1000 MCG tablet Take 1,000 mcg by mouth daily.    [provider]  diclofenac Sodium (VOLTAREN) 1 % GEL Apply 4 g topically every 6 (six) hours as needed (pain).    [provider]  finasteride (PROSCAR) 5 MG tablet Take 5 mg by mouth daily. 05/21/21   [provider]  lactose free nutrition (BOOST) LIQD Take 237 mLs by mouth daily.    [provider]  levothyroxine (SYNTHROID) 150 MCG tablet Take 150 mcg by mouth every morning. 02/21/23   [provider]  Omega-3 Fatty Acids (FISH OIL) 1000 MG CPDR Take 2 capsules by mouth 2 (two) times daily. 2- two times daily    [provider]  OVER THE COUNTER MEDICATION Take 1 tablet by mouth daily. Vegan multivitamin - one daily    [provider]  polyethylene glycol powder (GLYCOLAX/MIRALAX) 17 GM/SCOOP powder Take 8 g by mouth daily.    [provider]  tamsulosin (FLOMAX) 0.4 MG CAPS capsule Take 0.4 mg by mouth 2 (two) times daily.    [provider]  vitamin C (ASCORBIC ACID) 500 MG tablet Take 500 mg by mouth 2 (two) times daily.    [provider]  Vitamin D, Cholecalciferol, 10 MCG (400 UNIT) TABS Take 400 Units by mouth daily.    [provider]    Physical Exam    Vital Signs:   Given telephonic nature of communication, physical exam is limited. AAOx3. NAD. Normal affect.  Speech and respirations are unlabored.  Accessory Clinical Findings    None  Assessment & Plan    1.  Preoperative Cardiovascular Risk  Assessment: -Patient's RCRI score is 0.9%  The patient was advised that if he develops new symptoms prior to surgery to contact our office to arrange for a follow-up visit, and he verbalized understanding.   A copy of this note will be routed to requesting surgeon. Please reschedule visit for today  Napoleon Form, Leodis Rains, NP  04/06/2023, 7:55 AM

## 2023-04-11 ENCOUNTER — Ambulatory Visit: Payer: Medicare Other | Attending: Cardiovascular Disease | Admitting: Emergency Medicine

## 2023-04-11 DIAGNOSIS — Z01810 Encounter for preprocedural cardiovascular examination: Secondary | ICD-10-CM | POA: Diagnosis not present

## 2023-04-11 NOTE — Progress Notes (Signed)
Virtual Visit via Telephone Note   Because of Gilbert Clark's co-morbid illnesses, he is at least at moderate risk for complications without adequate follow up.  This format is felt to be most appropriate for this patient at this time.  The patient did not have access to video technology/had technical difficulties with video requiring transitioning to audio format only (telephone).  All issues noted in this document were discussed and addressed.  No physical exam could be performed with this format.  Please refer to the patient's chart for his consent to telehealth for [facility].  Evaluation Performed:  Preoperative cardiovascular risk assessment _____________   Date:  04/11/2023   Patient ID:  Gilbert Clark, DOB 12-11-1935, MRN [id number] Patient Location:  Home Provider location:   Office  Primary Care Provider:  Gweneth Dimitri, MD Primary Cardiologist:  Christell Constant, MD  Chief Complaint / Patient Profile   87 y.o. y/o male with a h/o PAF, Hypothyroidism, HTN, LBBB who is pending TURBT on 04/21/2023 with Dr. Annabell Howells and presents today for telephonic preoperative cardiovascular risk assessment.  History of Present Illness    Gilbert Clark is a 87 y.o. male who presents via audio/video conferencing for a telehealth visit today.  Pt was last seen in cardiology clinic on 07/05/2022 by Dr. Izora Ribas.  At that time Gilbert Clark was doing well.  The patient is now pending procedure as outlined above. Since his last visit, he denies chest pain, shortness of breath, lower extremity edema, fatigue, palpitations, melena, hemoptysis, diaphoresis, weakness, presyncope, syncope, orthopnea, and PND.  Past Medical History    Past Medical History:  Diagnosis Date   Anemia    Aphasia    Atrial fibrillation (HCC)    Benign prostatic hyperplasia with lower urinary tract symptoms    Bladder cancer (HCC)    BPH (benign prostatic hyperplasia)     Constipation    Dementia (HCC)    Fibromyalgia    GERD (gastroesophageal reflux disease)    Headache    High risk medication use    History of adenomatous polyp of colon    Hx of blood clots    Hyperlipidemia    Hypothyroidism    Memory loss    Polymyalgia rheumatica (HCC)    Pulmonary embolism (HCC)    hx of    Past Surgical History:  Procedure Laterality Date   ACHILLES TENDON REPAIR  2004   bilateral cataract surgery   2020   CYSTOSCOPY W/ RETROGRADES Bilateral 01/21/2020   Procedure: CYSTOSCOPY WITH BILATERAL RETROGRADE;  Surgeon: Bjorn Pippin, MD;  Location: [facility];  Service: Urology;  Laterality: Bilateral;   CYSTOSCOPY WITH URETEROSCOPY AND STENT PLACEMENT Right 06/25/2021   Procedure: URETEROSCOPY AND STENT PLACEMENT;  Surgeon: Bjorn Pippin, MD;  Location: [facility];  Service: Urology;  Laterality: Right;   IVC FILTER INSERTION      2000   TRANSURETHRAL RESECTION OF BLADDER TUMOR WITH GYRUS (TURBT-GYRUS)     multiple times    TRANSURETHRAL RESECTION OF BLADDER TUMOR WITH MITOMYCIN-C N/A 01/21/2020   Procedure: TRANSURETHRAL RESECTION OF BLADDER TUMOR WITH GEMCITABINE;  Surgeon: Bjorn Pippin, MD;  Location: [facility];  Service: Urology;  Laterality: N/A;   TRANSURETHRAL RESECTION OF BLADDER TUMOR WITH MITOMYCIN-C Bilateral 06/25/2021   Procedure: CYSTOSCOPY BILATERAL RETROGRADES TRANSURETHRAL RESECTION OF BLADDER TUMOR;  Surgeon: Bjorn Pippin, MD;  Location: [facility];  Service: Urology;  Laterality: Bilateral;    Allergies  Allergies  Allergen Reactions  Augmentin [Amoxicillin-Pot Clavulanate]     On list due to father was allergic    Epinephrine     On list because father was allergic    Sulfa Antibiotics     Unknown  On list because father was allergic     Home Medications    Prior to Admission medications   Medication Sig Start Date End Date Taking? Authorizing Provider  acetaminophen (TYLENOL) 500 MG tablet Take 500 mg by mouth  every 6 (six) hours as needed for mild pain.    [provider]  amLODipine (NORVASC) 2.5 MG tablet Take 2.5 mg by mouth daily.    [provider]  apixaban (ELIQUIS) 5 MG TABS tablet Take 5 mg by mouth 2 (two) times daily.    [provider]  cyanocobalamin (VITAMIN B12) 1000 MCG tablet Take 1,000 mcg by mouth daily.    [provider]  diclofenac Sodium (VOLTAREN) 1 % GEL Apply 4 g topically every 6 (six) hours as needed (pain).    [provider]  finasteride (PROSCAR) 5 MG tablet Take 5 mg by mouth daily. 05/21/21   [provider]  lactose free nutrition (BOOST) LIQD Take 237 mLs by mouth daily.    [provider]  levothyroxine (SYNTHROID) 150 MCG tablet Take 150 mcg by mouth every morning. 02/21/23   [provider]  Omega-3 Fatty Acids (FISH OIL) 1000 MG CPDR Take 2 capsules by mouth 2 (two) times daily. 2- two times daily    [provider]  OVER THE COUNTER MEDICATION Take 1 tablet by mouth daily. Vegan multivitamin - one daily    [provider]  polyethylene glycol powder (GLYCOLAX/MIRALAX) 17 GM/SCOOP powder Take 8 g by mouth daily.    [provider]  tamsulosin (FLOMAX) 0.4 MG CAPS capsule Take 0.4 mg by mouth 2 (two) times daily.    [provider]  vitamin C (ASCORBIC ACID) 500 MG tablet Take 500 mg by mouth 2 (two) times daily.    [provider]  Vitamin D, Cholecalciferol, 10 MCG (400 UNIT) TABS Take 400 Units by mouth daily.    [provider]    Physical Exam    Vital Signs:  Gilbert Clark does not have vital signs available for review today.  Given telephonic nature of communication, physical exam is limited. AAOx3. NAD. Normal affect.  Speech and respirations are unlabored.  Accessory Clinical Findings    None  Assessment & Plan    1.  Preoperative Cardiovascular Risk Assessment: According to the Revised Cardiac Risk Index (RCRI), his  Perioperative Risk of Major Cardiac Event is (%): 0.9. His Functional Capacity in METs is: 5.62 according to the [facility] Activity Status Index (DASI). Therefore, based on ACC/AHA guidelines, patient would be at acceptable risk for the planned procedure without further cardiovascular testing. I will route this recommendation to the requesting party via Epic fax function.  The patient was advised that if he develops new symptoms prior to surgery to contact our office to arrange for a follow-up visit, and he verbalized understanding.  Per office protocol, patient can hold Eliquis for 2-3 days prior to procedure. Please resume Eliquis as soon as possible postprocedure, at the discretion of the surgeon.    A copy of this note will be routed to requesting surgeon.  Time:   Today, I have spent 8 minutes with the patient with telehealth technology discussing medical history, symptoms, and management plan.     Denyce Robert,  NP  04/11/2023, 10:48 AM

## 2023-04-14 ENCOUNTER — Other Ambulatory Visit: Payer: Self-pay | Admitting: Internal Medicine

## 2023-04-17 NOTE — Patient Instructions (Addendum)
 SURGICAL WAITING ROOM VISITATION Patients having surgery or a procedure may have no more than 2 support people in the waiting area - these visitors may rotate.    Children under the age of 78 must have an adult with them who is not the patient.  If the patient needs to stay at the hospital during part of their recovery, the visitor guidelines for inpatient rooms apply. Pre-op nurse will coordinate an appropriate time for 1 support person to accompany patient in pre-op.  This support person may not rotate.    Please refer to the [facility] website for the visitor guidelines for Inpatients (after your surgery is over and you are in a regular room).      Your procedure is scheduled on: 04/21/23   Report to [facility] Main Entrance    Report to admitting at : 7:45 AM   Call this number if you have problems the morning of surgery [phone]   Do not eat food or drink: After Midnight.   FOLLOW ANY ADDITIONAL PRE OP INSTRUCTIONS YOU RECEIVED FROM YOUR SURGEON'S OFFICE!!!  Oral Hygiene is also important to reduce your risk of infection.                                    Remember - BRUSH YOUR TEETH THE MORNING OF SURGERY WITH YOUR REGULAR TOOTHPASTE  DENTURES WILL BE REMOVED PRIOR TO SURGERY PLEASE DO NOT APPLY Poly grip OR ADHESIVES!!!   Do NOT smoke after Midnight   Take these medicines the morning of surgery with A SIP OF WATER: Tamsulosin Levothyroxine Tylenol  as needed.  Hold Eliquis until after surgery                   You may not have any metal on your body including hair pins, jewelry, and body piercing             Do not wear lotions, powders, perfumes/cologne, or deodorant              Men may shave face and neck.   Do not bring valuables to the hospital. [facility] IS NOT             RESPONSIBLE   FOR VALUABLES.   Contacts, glasses, or bridgework may not be worn into surgery.   Bring small overnight bag day of surgery.   DO NOT BRING YOUR HOME  MEDICATIONS TO THE HOSPITAL. PHARMACY WILL DISPENSE MEDICATIONS LISTED ON YOUR MEDICATION LIST TO YOU DURING YOUR ADMISSION IN THE HOSPITAL!    Patients discharged on the day of surgery will not be allowed to drive home.  Someone NEEDS to stay with you for the first 24 hours after anesthesia.   Special Instructions: Bring a copy of your healthcare power of attorney and living will documents         the day of surgery if you haven't scanned them before.              Please read over the following fact sheets you were given: IF YOU HAVE QUESTIONS ABOUT YOUR PRE-OP INSTRUCTIONS PLEASE CALL [phone] [facility] - Preparing for Surgery Before surgery, you can play an important role.  Because skin is not sterile, your skin needs to be as free of germs as possible.  You can reduce the number of germs on your skin by washing with CHG (chlorahexidine gluconate)  soap before surgery.  CHG is an antiseptic cleaner which kills germs and bonds with the skin to continue killing germs even after washing. Please DO NOT use if you have an allergy to CHG or antibacterial soaps.  If your skin becomes reddened/irritated stop using the CHG and inform your nurse when you arrive at Short Stay. Do not shave (including legs and underarms) for at least 48 hours prior to the first CHG shower.  You may shave your face/neck. Please follow these instructions carefully:  1.  Shower with CHG Soap the night before surgery and the  morning of Surgery.  2.  If you choose to wash your hair, wash your hair first as usual with your  normal  shampoo.  3.  After you shampoo, rinse your hair and body thoroughly to remove the  shampoo.                           4.  Use CHG as you would any other liquid soap.  You can apply chg directly  to the skin and wash                       Gently with a scrungie or clean washcloth.  5.  Apply the CHG Soap to your body ONLY FROM THE NECK DOWN.   Do not use on face/ open                            Wound or open sores. Avoid contact with eyes, ears mouth and genitals (private parts).                       Wash face,  Genitals (private parts) with your normal soap.             6.  Wash thoroughly, paying special attention to the area where your surgery  will be performed.  7.  Thoroughly rinse your body with warm water from the neck down.  8.  DO NOT shower/wash with your normal soap after using and rinsing off  the CHG Soap.                9.  Pat yourself dry with a clean towel.            10.  Wear clean pajamas.            11.  Place clean sheets on your bed the night of your first shower and do not  sleep with pets. Day of Surgery : Do not apply any lotions/deodorants the morning of surgery.  Please wear clean clothes to the hospital/surgery center.  FAILURE TO FOLLOW THESE INSTRUCTIONS MAY RESULT IN THE CANCELLATION OF YOUR SURGERY PATIENT SIGNATURE_________________________________  NURSE SIGNATURE__________________________________  ________________________________________________________________________

## 2023-04-18 ENCOUNTER — Encounter (HOSPITAL_COMMUNITY)
Admission: RE | Admit: 2023-04-18 | Discharge: 2023-04-18 | Disposition: A | Discharge status: Home / Self Care | Payer: Medicare Other | Source: Ambulatory Visit | Attending: Urology | Admitting: Urology

## 2023-04-18 ENCOUNTER — Other Ambulatory Visit: Payer: Self-pay

## 2023-04-18 ENCOUNTER — Encounter (HOSPITAL_COMMUNITY): Payer: Self-pay

## 2023-04-18 VITALS — BP 130/81 | HR 81 | Temp 98.3°F | Resp 20 | Ht 71.0 in | Wt 198.0 lb

## 2023-04-18 DIAGNOSIS — E039 Hypothyroidism, unspecified: Secondary | ICD-10-CM | POA: Insufficient documentation

## 2023-04-18 DIAGNOSIS — Z86718 Personal history of other venous thrombosis and embolism: Secondary | ICD-10-CM | POA: Diagnosis not present

## 2023-04-18 DIAGNOSIS — Z86711 Personal history of pulmonary embolism: Secondary | ICD-10-CM | POA: Diagnosis not present

## 2023-04-18 DIAGNOSIS — K219 Gastro-esophageal reflux disease without esophagitis: Secondary | ICD-10-CM | POA: Diagnosis not present

## 2023-04-18 DIAGNOSIS — C672 Malignant neoplasm of lateral wall of bladder: Secondary | ICD-10-CM | POA: Diagnosis not present

## 2023-04-18 DIAGNOSIS — I480 Paroxysmal atrial fibrillation: Secondary | ICD-10-CM | POA: Diagnosis not present

## 2023-04-18 DIAGNOSIS — I447 Left bundle-branch block, unspecified: Secondary | ICD-10-CM | POA: Insufficient documentation

## 2023-04-18 DIAGNOSIS — M353 Polymyalgia rheumatica: Secondary | ICD-10-CM | POA: Diagnosis not present

## 2023-04-18 DIAGNOSIS — Z01812 Encounter for preprocedural laboratory examination: Secondary | ICD-10-CM | POA: Diagnosis not present

## 2023-04-18 DIAGNOSIS — M797 Fibromyalgia: Secondary | ICD-10-CM | POA: Diagnosis not present

## 2023-04-18 DIAGNOSIS — Z7901 Long term (current) use of anticoagulants: Secondary | ICD-10-CM | POA: Insufficient documentation

## 2023-04-18 DIAGNOSIS — Z960 Presence of urogenital implants: Secondary | ICD-10-CM | POA: Diagnosis not present

## 2023-04-18 DIAGNOSIS — F0390 Unspecified dementia without behavioral disturbance: Secondary | ICD-10-CM | POA: Insufficient documentation

## 2023-04-18 DIAGNOSIS — Z87891 Personal history of nicotine dependence: Secondary | ICD-10-CM | POA: Diagnosis not present

## 2023-04-18 DIAGNOSIS — Z8551 Personal history of malignant neoplasm of bladder: Secondary | ICD-10-CM | POA: Diagnosis not present

## 2023-04-18 DIAGNOSIS — N329 Bladder disorder, unspecified: Secondary | ICD-10-CM | POA: Diagnosis not present

## 2023-04-18 DIAGNOSIS — R8271 Bacteriuria: Secondary | ICD-10-CM | POA: Diagnosis not present

## 2023-04-18 LAB — CBC
HCT: 41.2 % (ref 39.0–52.0)
Hemoglobin: 13.7 g/dL (ref 13.0–17.0)
MCH: 32.2 pg (ref 26.0–34.0)
MCHC: 33.3 g/dL (ref 30.0–36.0)
MCV: 96.7 fL (ref 80.0–100.0)
Platelets: 168 10*3/uL (ref 150–400)
RBC: 4.26 MIL/uL (ref 4.22–5.81)
RDW: 12.7 % (ref 11.5–15.5)
WBC: 6.7 10*3/uL (ref 4.0–10.5)
nRBC: 0 % (ref 0.0–0.2)

## 2023-04-18 LAB — BASIC METABOLIC PANEL
Anion gap: 7 (ref 5–15)
BUN: 27 mg/dL — ABNORMAL HIGH (ref 8–23)
CO2: 23 mmol/L (ref 22–32)
Calcium: 10 mg/dL (ref 8.9–10.3)
Chloride: 110 mmol/L (ref 98–111)
Creatinine, Ser: 1.44 mg/dL — ABNORMAL HIGH (ref 0.61–1.24)
GFR, Estimated: 48 mL/min — ABNORMAL LOW (ref >60)
Glucose, Bld: 106 mg/dL — ABNORMAL HIGH (ref 70–99)
Potassium: 4.4 mmol/L (ref 3.5–5.1)
Sodium: 140 mmol/L (ref 135–145)

## 2023-04-18 NOTE — Progress Notes (Signed)
Patient here for PAT appointment, patient has history of expressive aphasia but was able to tell me name, DOB, where is was and what surgery he was having.

## 2023-04-18 NOTE — Progress Notes (Signed)
 COVID Vaccine Completed:  Date of COVID positive in last 90 days:  No  PCP - Sari Cable, MD Cardiologist - Stanly Leavens, MD Neurologist - Eduard Hanlon, MD  Cardiac clearance in Epic dated 04-11-23  Chest x-ray - N/A EKG - 07-05-22 Epic Stress Test - N/A ECHO - 01-13-22 Epic Cardiac Cath - N/A Pacemaker/ICD device last checked: Spinal Cord Stimulator: N/A IVC filter Long Term Monitor - 02-01-22 Epic  Bowel Prep - N/A  Sleep Study - N/A CPAP -   Fasting Blood Sugar - N/A Checks Blood Sugar _____ times a day  Last dose of GLP1 agonist-  N/A GLP1 instructions:  Hold 7 days before surgery    Last dose of SGLT-2 inhibitors-  N/A SGLT-2 instructions:  Hold 3 days before surgery    Blood Thinner Instructions:  Eliquis.  Hold 2-3 days. Patient took last dose on 04-17-23 Aspirin Instructions: Last Dose:  Activity level:  Can go up a flight of stairs and perform activities of daily living without stopping and without symptoms of chest pain or shortness of breath.  Able to exercise without symptoms  Patient lives alone  Anesthesia review: Afib, HTN, LBBB  Patient has expressive aphasia.    Patient denies shortness of breath, fever, cough and chest pain at PAT appointment  Patient verbalized understanding of instructions that were given to them at the PAT appointment. Patient was also instructed that they will need to review over the PAT instructions again at home before surgery.

## 2023-04-20 NOTE — Anesthesia Preprocedure Evaluation (Addendum)
 Anesthesia Evaluation  Patient identified by MRN, date of birth, ID band Patient awake    Reviewed: Allergy & Precautions, NPO status , Patient's Chart, lab work & pertinent test results  Airway Mallampati: I  TM Distance: >3 FB Neck ROM: Full    Dental no notable dental hx. (+) Teeth Intact, Dental Advisory Given   Pulmonary former smoker, PE   Pulmonary exam normal breath sounds clear to auscultation       Cardiovascular + dysrhythmias Atrial Fibrillation  Rhythm:Regular Rate:Normal     Neuro/Psych  Headaches PSYCHIATRIC DISORDERS     Dementia  Neuromuscular disease    GI/Hepatic Neg liver ROS,GERD  ,,  Endo/Other  Hypothyroidism    Renal/GU negative Renal ROS  negative genitourinary   Musculoskeletal  (+)  Fibromyalgia -  Abdominal   Peds  Hematology  (+) Blood dyscrasia (on eliquis)   Anesthesia Other Findings   Reproductive/Obstetrics                             Anesthesia Physical Anesthesia Plan  ASA: 3  Anesthesia Plan: General   Post-op Pain Management: Tylenol  PO (pre-op)*   Induction: Intravenous  PONV Risk Score and Plan: 3 and Ondansetron , Dexamethasone  and Treatment may vary due to age or medical condition  Airway Management Planned: Oral ETT and LMA  Additional Equipment: None  Intra-op Plan:   Post-operative Plan: Extubation in OR  Informed Consent: I have reviewed the patients History and Physical, chart, labs and discussed the procedure including the risks, benefits and alternatives for the proposed anesthesia with the patient or authorized representative who has indicated his/her understanding and acceptance.     Dental advisory given  Plan Discussed with: CRNA and Anesthesiologist  Anesthesia Plan Comments: (See PAT note from 12/31 by MARLA Senna PA-C DISCUSSION: Gilbert Clark is an 88 yo male who presents to PAT prior to surgery above. PMH of former  smoking (quit 1960s), PAF on Eliquis, LBBB, hx of DVT/PE s/p IVC filter, expressive aphasia, memory loss, GERD, hypothyroidism, fibromyalgia, PMR, hx of bladder cancer s/p TURBT (2021, 2023)   Patient follows with Cardiology for hx of Afib on Eliquis. Last seen on 07/05/2022. Doing well at that visit and advised to f/u in 1 year. Tolerating Eliquis. Had pre op tele visit on 04/11/23 and was cleared:    Preoperative Cardiovascular Risk Assessment: According to the Revised Cardiac Risk Index (RCRI), his Perioperative Risk of Major Cardiac Event is (%): 0.9. His Functional Capacity in METs is: 5.62 according to the Duke Activity Status Index (DASI). Therefore, based on ACC/AHA guidelines, patient would be at acceptable risk for the planned procedure without further cardiovascular testing. I will route this recommendation to the requesting party via Epic fax function. Per office protocol, patient can hold Eliquis for 2-3 days prior to procedure. Please resume Eliquis as soon as possible postprocedure, at the discretion of the surgeon.     Patient has hx of expressive aphasia. He has been evaluated by Neurology in the past. Last seen on 05/27/2020. MRI brain showed a tiny chronic right posterior frontal lobe cortical infarct but otherwise there were no acute abnormalities. Symptoms not thought to be related to a CVA. No further f/u needed.   Patient follows with PCP. Last seen on 03/20/23. Per family at that visit patient has been having more difficulty communicating in the last 3-4 months and also understanding what is said to him.   Holter monitor 02/01/2022:  Patient had a minimum heart rate of 31 bpm, maximum heart rate of 151 bpm, and average heart rate of 64 bpm.   Predominant underlying rhythm was sinus rhythm.   Atrial fibrillation with 7% burden, average rate of 67 bpm, longest episode nearing 4 hours.   Isolated PACs were occasional (4%).   Isolated PVCs were rare (<1.0%).    Triggered and diary events associated with sinus rhythm, sinus bradycardia, and atrial fibrillation.   Potentially symptomatic atrial fibrillation.   TTE 01/13/2022:     IMPRESSIONS      1. Left ventricular ejection fraction, by estimation, is 50 to 55%. The left ventricle has low normal function. The left ventricle has no regional wall motion abnormalities. Left ventricular diastolic parameters are consistent with Grade I diastolic dysfunction (impaired relaxation). The average left ventricular global longitudinal strain is -22.3 %. The global longitudinal strain is normal.  2. Right ventricular systolic function is normal. The right ventricular size is normal. Tricuspid regurgitation signal is inadequate for assessing PA pressure.  3. The mitral valve is normal in structure. No evidence of mitral valve regurgitation. No evidence of mitral stenosis. Moderate mitral annular calcification.  4. The aortic valve is tricuspid. Aortic valve regurgitation is not visualized. Aortic valve sclerosis is present, with no evidence of aortic valve stenosis.  5. The inferior vena cava is normal in size with greater than 50% respiratory variability, suggesting right atrial pressure of 3 mmHg.  )        Anesthesia Quick Evaluation

## 2023-04-20 NOTE — Progress Notes (Signed)
 Case: [id number] Date/Time: 04/21/23 0945   Procedure: TRANSURETHRAL RESECTION OF BLADDER TUMOR (TURBT) with possible GEMCITABINE /CYSTOSCOPY WITH RETROGRADE PYLOGRAM - 1 HOUR CASE   Anesthesia type: General   Pre-op diagnosis: BLADDER LESION   Location: [facility]   Surgeons: Watt Rush, MD       DISCUSSION: Gilbert Clark is an 88 yo male who presents to PAT prior to surgery above. PMH of former smoking (quit 1960s), PAF on Eliquis, LBBB, hx of DVT/PE s/p IVC filter, expressive aphasia, memory loss, GERD, hypothyroidism, fibromyalgia, PMR, hx of bladder cancer s/p TURBT (2021, 2023)  Patient follows with Cardiology for hx of Afib on Eliquis. Last seen on 07/05/2022. Doing well at that visit and advised to f/u in 1 year. Tolerating Eliquis. Had pre op tele visit on 04/11/23 and was cleared:   Preoperative Cardiovascular Risk Assessment: According to the Revised Cardiac Risk Index (RCRI), his Perioperative Risk of Major Cardiac Event is (%): 0.9. His Functional Capacity in METs is: 5.62 according to the Duke Activity Status Index (DASI). Therefore, based on ACC/AHA guidelines, patient would be at acceptable risk for the planned procedure without further cardiovascular testing. I will route this recommendation to the requesting party via Epic fax function. Per office protocol, patient can hold Eliquis for 2-3 days prior to procedure. Please resume Eliquis as soon as possible postprocedure, at the discretion of the surgeon.    Patient has hx of expressive aphasia. He has been evaluated by Neurology in the past. Last seen on 05/27/2020. MRI brain showed a tiny chronic right posterior frontal lobe cortical infarct but otherwise there were no acute abnormalities. Symptoms not thought to be related to a CVA. No further f/u needed.  Patient follows with PCP. Last seen on 03/20/23. Per family at that visit patient has been having more difficulty communicating in the last 3-4 months  and also understanding what is said to him.  VS: BP 130/81   Pulse 81   Temp 36.8 C (Oral)   Resp 20   Ht 5' 11 (1.803 m)   Wt 89.8 kg   SpO2 98%   BMI 27.62 kg/m   PROVIDERS: Aisha Harvey, MD Cardiologist - Stanly Leavens, MD Neurologist - Eduard Hanlon, MD  LABS: Labs reviewed: Acceptable for surgery. CKD stable. (all labs ordered are listed, but only abnormal results are displayed)  Labs Reviewed  BASIC METABOLIC PANEL - Abnormal; Notable for the following components:      Result Value   Glucose, Bld 106 (*)    BUN 27 (*)    Creatinine, Ser 1.44 (*)    GFR, Estimated 48 (*)    All other components within normal limits  CBC     IMAGES:   EKG:   CV:  Holter monitor 02/01/2022:     Patient had a minimum heart rate of 31 bpm, maximum heart rate of 151 bpm, and average heart rate of 64 bpm.   Predominant underlying rhythm was sinus rhythm.   Atrial fibrillation with 7% burden, average rate of 67 bpm, longest episode nearing 4 hours.   Isolated PACs were occasional (4%).   Isolated PVCs were rare (<1.0%).   Triggered and diary events associated with sinus rhythm, sinus bradycardia, and atrial fibrillation.   Potentially symptomatic atrial fibrillation.  TTE 01/13/2022:   IMPRESSIONS    1. Left ventricular ejection fraction, by estimation, is 50 to 55%. The left ventricle has low normal function. The left ventricle has no regional wall motion abnormalities.  Left ventricular diastolic parameters are consistent with Grade I diastolic dysfunction (impaired relaxation). The average left ventricular global longitudinal strain is -22.3 %. The global longitudinal strain is normal.  2. Right ventricular systolic function is normal. The right ventricular size is normal. Tricuspid regurgitation signal is inadequate for assessing PA pressure.  3. The mitral valve is normal in structure. No evidence of mitral valve regurgitation. No evidence of mitral  stenosis. Moderate mitral annular calcification.  4. The aortic valve is tricuspid. Aortic valve regurgitation is not visualized. Aortic valve sclerosis is present, with no evidence of aortic valve stenosis.  5. The inferior vena cava is normal in size with greater than 50% respiratory variability, suggesting right atrial pressure of 3 mmHg.  Comparison(s): No prior Echocardiogram.  Past Medical History:  Diagnosis Date   Anemia    Aphasia    Atrial fibrillation (HCC)    Benign prostatic hyperplasia with lower urinary tract symptoms    Bladder cancer (HCC)    BPH (benign prostatic hyperplasia)    Constipation    Dementia (HCC)    Fibromyalgia    GERD (gastroesophageal reflux disease)    Headache    High risk medication use    History of adenomatous polyp of colon    Hx of blood clots    Hyperlipidemia    Hypothyroidism    Memory loss    Polymyalgia rheumatica (HCC)    Pulmonary embolism (HCC)    hx of     Past Surgical History:  Procedure Laterality Date   ACHILLES TENDON REPAIR  2004   bilateral cataract surgery   2020   CYSTOSCOPY W/ RETROGRADES Bilateral 01/21/2020   Procedure: CYSTOSCOPY WITH BILATERAL RETROGRADE;  Surgeon: Watt Rush, MD;  Location: [facility];  Service: Urology;  Laterality: Bilateral;   CYSTOSCOPY WITH URETEROSCOPY AND STENT PLACEMENT Right 06/25/2021   Procedure: URETEROSCOPY AND STENT PLACEMENT;  Surgeon: Watt Rush, MD;  Location: [facility];  Service: Urology;  Laterality: Right;   IVC FILTER INSERTION      2000   TRANSURETHRAL RESECTION OF BLADDER TUMOR WITH GYRUS (TURBT-GYRUS)     multiple times    TRANSURETHRAL RESECTION OF BLADDER TUMOR WITH MITOMYCIN -C N/A 01/21/2020   Procedure: TRANSURETHRAL RESECTION OF BLADDER TUMOR WITH GEMCITABINE ;  Surgeon: Watt Rush, MD;  Location: [facility];  Service: Urology;  Laterality: N/A;   TRANSURETHRAL RESECTION OF BLADDER TUMOR WITH MITOMYCIN -C Bilateral 06/25/2021    Procedure: CYSTOSCOPY BILATERAL RETROGRADES TRANSURETHRAL RESECTION OF BLADDER TUMOR;  Surgeon: Watt Rush, MD;  Location: [facility];  Service: Urology;  Laterality: Bilateral;    MEDICATIONS:  acetaminophen  (TYLENOL ) 500 MG tablet   apixaban (ELIQUIS) 5 MG TABS tablet   furosemide  (LASIX ) 20 MG tablet   levothyroxine (SYNTHROID) 150 MCG tablet   Omega-3 Fatty Acids (FISH OIL) 1000 MG CPDR   OVER THE COUNTER MEDICATION   tamsulosin (FLOMAX) 0.4 MG CAPS capsule   triamcinolone cream (KENALOG) 0.5 %   vitamin C (ASCORBIC ACID) 500 MG tablet   Wheat Dextrin (BENEFIBER) POWD   No current facility-administered medications for this encounter.   Burnard CHRISTELLA Odis DEVONNA [facility] Stay/Anesthesiology [facility] Phone [phone] 04/20/2023 8:37 AM

## 2023-04-21 ENCOUNTER — Ambulatory Visit (HOSPITAL_COMMUNITY): Payer: Medicare Other | Admitting: Medical

## 2023-04-21 ENCOUNTER — Other Ambulatory Visit: Payer: Self-pay

## 2023-04-21 ENCOUNTER — Ambulatory Visit (HOSPITAL_COMMUNITY): Payer: Medicare Other

## 2023-04-21 ENCOUNTER — Encounter (HOSPITAL_COMMUNITY): Payer: Self-pay | Admitting: Urology

## 2023-04-21 ENCOUNTER — Encounter (HOSPITAL_COMMUNITY)
Admission: RE | Disposition: A | Discharge status: Home / Self Care | Payer: Self-pay | Source: Home / Self Care | Attending: Urology

## 2023-04-21 ENCOUNTER — Ambulatory Visit (HOSPITAL_BASED_OUTPATIENT_CLINIC_OR_DEPARTMENT_OTHER): Payer: Medicare Other | Admitting: Anesthesiology

## 2023-04-21 ENCOUNTER — Ambulatory Visit (HOSPITAL_COMMUNITY)
Admission: RE | Admit: 2023-04-21 | Discharge: 2023-04-21 | Disposition: A | Discharge status: Home / Self Care | Payer: Medicare Other | Attending: Urology | Admitting: Urology

## 2023-04-21 DIAGNOSIS — C679 Malignant neoplasm of bladder, unspecified: Secondary | ICD-10-CM

## 2023-04-21 DIAGNOSIS — Z7901 Long term (current) use of anticoagulants: Secondary | ICD-10-CM | POA: Diagnosis not present

## 2023-04-21 DIAGNOSIS — Z79890 Hormone replacement therapy (postmenopausal): Secondary | ICD-10-CM | POA: Diagnosis not present

## 2023-04-21 DIAGNOSIS — F0390 Unspecified dementia without behavioral disturbance: Secondary | ICD-10-CM | POA: Insufficient documentation

## 2023-04-21 DIAGNOSIS — Z87891 Personal history of nicotine dependence: Secondary | ICD-10-CM | POA: Insufficient documentation

## 2023-04-21 DIAGNOSIS — I480 Paroxysmal atrial fibrillation: Secondary | ICD-10-CM | POA: Diagnosis not present

## 2023-04-21 DIAGNOSIS — K219 Gastro-esophageal reflux disease without esophagitis: Secondary | ICD-10-CM | POA: Diagnosis not present

## 2023-04-21 DIAGNOSIS — N401 Enlarged prostate with lower urinary tract symptoms: Secondary | ICD-10-CM | POA: Insufficient documentation

## 2023-04-21 DIAGNOSIS — N320 Bladder-neck obstruction: Secondary | ICD-10-CM | POA: Diagnosis not present

## 2023-04-21 DIAGNOSIS — R4701 Aphasia: Secondary | ICD-10-CM | POA: Insufficient documentation

## 2023-04-21 DIAGNOSIS — I4891 Unspecified atrial fibrillation: Secondary | ICD-10-CM | POA: Diagnosis not present

## 2023-04-21 DIAGNOSIS — D494 Neoplasm of unspecified behavior of bladder: Secondary | ICD-10-CM | POA: Diagnosis not present

## 2023-04-21 DIAGNOSIS — Z79899 Other long term (current) drug therapy: Secondary | ICD-10-CM | POA: Insufficient documentation

## 2023-04-21 DIAGNOSIS — M797 Fibromyalgia: Secondary | ICD-10-CM | POA: Insufficient documentation

## 2023-04-21 DIAGNOSIS — E039 Hypothyroidism, unspecified: Secondary | ICD-10-CM | POA: Diagnosis not present

## 2023-04-21 DIAGNOSIS — D090 Carcinoma in situ of bladder: Secondary | ICD-10-CM | POA: Diagnosis not present

## 2023-04-21 SURGERY — TURBT, WITH CHEMOTHERAPEUTIC AGENT INSTILLATION INTO BLADDER
Anesthesia: General | Site: Bladder

## 2023-04-21 MED ORDER — LIDOCAINE HCL (CARDIAC) PF 100 MG/5ML IV SOSY
PREFILLED_SYRINGE | INTRAVENOUS | Status: DC | PRN
  Administered 2023-04-21: 100 mg via INTRAVENOUS

## 2023-04-21 MED ORDER — ROCURONIUM BROMIDE 100 MG/10ML IV SOLN
INTRAVENOUS | Status: DC | PRN
  Administered 2023-04-21: 30 mg via INTRAVENOUS

## 2023-04-21 MED ORDER — DEXAMETHASONE SODIUM PHOSPHATE 10 MG/ML IJ SOLN
INTRAMUSCULAR | Status: DC | PRN
  Administered 2023-04-21: 5 mg via INTRAVENOUS

## 2023-04-21 MED ORDER — LACTATED RINGERS IV SOLN: INTRAVENOUS | Status: DC

## 2023-04-21 MED ORDER — PROPOFOL 10 MG/ML IV BOLUS
INTRAVENOUS | Status: DC | PRN
  Administered 2023-04-21: 30 mg via INTRAVENOUS
  Administered 2023-04-21: 130 mg via INTRAVENOUS

## 2023-04-21 MED ORDER — SODIUM CHLORIDE 0.9% FLUSH
3.0000 mL | Freq: Two times a day (BID) | INTRAVENOUS | Status: DC
Start: 2023-04-21 — End: 2023-04-21

## 2023-04-21 MED ORDER — OXYCODONE HCL 5 MG PO TABS: 5.0000 mg | ORAL_TABLET | Freq: Once | ORAL | Status: DC | PRN

## 2023-04-21 MED ORDER — SODIUM CHLORIDE 0.9 % IR SOLN
Status: DC | PRN
  Administered 2023-04-21 (×2): 3000 mL

## 2023-04-21 MED ORDER — FENTANYL CITRATE PF 50 MCG/ML IJ SOSY: 25.0000 ug | PREFILLED_SYRINGE | INTRAMUSCULAR | Status: DC | PRN

## 2023-04-21 MED ORDER — ONDANSETRON HCL 4 MG/2ML IJ SOLN: 4.0000 mg | Freq: Once | INTRAMUSCULAR | Status: DC | PRN

## 2023-04-21 MED ORDER — EPHEDRINE SULFATE-NACL 50-0.9 MG/10ML-% IV SOSY
PREFILLED_SYRINGE | INTRAVENOUS | Status: DC | PRN
  Administered 2023-04-21: 5 mg via INTRAVENOUS

## 2023-04-21 MED ORDER — ACETAMINOPHEN 160 MG/5ML PO SOLN: 325.0000 mg | ORAL | Status: DC | PRN

## 2023-04-21 MED ORDER — CHLORHEXIDINE GLUCONATE 0.12 % MT SOLN
15.0000 mL | Freq: Once | OROMUCOSAL | Status: AC
Start: 2023-04-21 — End: 2023-04-21
  Administered 2023-04-21: 15 mL via OROMUCOSAL

## 2023-04-21 MED ORDER — GEMCITABINE CHEMO FOR BLADDER INSTILLATION 2000 MG: 2000.0000 mg | Freq: Once | INTRAVENOUS | Status: DC

## 2023-04-21 MED ORDER — GEMCITABINE CHEMO FOR BLADDER INSTILLATION 2000 MG
2000.0000 mg | Freq: Once | INTRAVENOUS | Status: AC
  Administered 2023-04-21: 2000 mg via INTRAVESICAL
  Filled 2023-04-21: qty 2000

## 2023-04-21 MED ORDER — MEPERIDINE HCL 50 MG/ML IJ SOLN: 6.2500 mg | INTRAMUSCULAR | Status: DC | PRN

## 2023-04-21 MED ORDER — ONDANSETRON HCL 4 MG/2ML IJ SOLN
INTRAMUSCULAR | Status: DC | PRN
  Administered 2023-04-21: 4 mg via INTRAVENOUS

## 2023-04-21 MED ORDER — ACETAMINOPHEN 500 MG PO TABS
1000.0000 mg | ORAL_TABLET | Freq: Once | ORAL | Status: AC
  Administered 2023-04-21: 1000 mg via ORAL
  Filled 2023-04-21: qty 2

## 2023-04-21 MED ORDER — ORAL CARE MOUTH RINSE: 15.0000 mL | Freq: Once | OROMUCOSAL | Status: AC

## 2023-04-21 MED ORDER — OXYCODONE HCL 5 MG/5ML PO SOLN: 5.0000 mg | Freq: Once | ORAL | Status: DC | PRN

## 2023-04-21 MED ORDER — CIPROFLOXACIN IN D5W 400 MG/200ML IV SOLN
400.0000 mg | INTRAVENOUS | Status: AC
  Administered 2023-04-21: 400 mg via INTRAVENOUS
  Filled 2023-04-21: qty 200

## 2023-04-21 MED ORDER — PHENYLEPHRINE HCL-NACL 20-0.9 MG/250ML-% IV SOLN
INTRAVENOUS | Status: DC | PRN
  Administered 2023-04-21: 80 ug via INTRAVENOUS

## 2023-04-21 MED ORDER — SUGAMMADEX SODIUM 200 MG/2ML IV SOLN
INTRAVENOUS | Status: DC | PRN
  Administered 2023-04-21: 200 mg via INTRAVENOUS

## 2023-04-21 MED ORDER — FENTANYL CITRATE (PF) 100 MCG/2ML IJ SOLN
INTRAMUSCULAR | Status: AC
  Filled 2023-04-21: qty 2

## 2023-04-21 MED ORDER — ACETAMINOPHEN 325 MG PO TABS: 325.0000 mg | ORAL_TABLET | ORAL | Status: DC | PRN

## 2023-04-21 MED ORDER — IOHEXOL 300 MG/ML  SOLN
INTRAMUSCULAR | Status: DC | PRN
  Administered 2023-04-21: 18 mL

## 2023-04-21 MED ORDER — PROPOFOL 10 MG/ML IV BOLUS
INTRAVENOUS | Status: AC
  Filled 2023-04-21: qty 20

## 2023-04-21 SURGICAL SUPPLY — 20 items
BAG URINE DRAIN 2000ML AR STRL (UROLOGICAL SUPPLIES) IMPLANT
BAG URO CATCHER STRL LF (MISCELLANEOUS) ×1 IMPLANT
CATH FOLEY 2WAY SLVR 5CC 18FR (CATHETERS) IMPLANT
CATH FOLEY 3WAY 30CC 22FR (CATHETERS) IMPLANT
CATH URETL OPEN 5X70 (CATHETERS) IMPLANT
DRAPE FOOT SWITCH (DRAPES) ×1 IMPLANT
ELECT REM PT RETURN 15FT ADLT (MISCELLANEOUS) ×1 IMPLANT
GLOVE SURG SS PI 8.0 STRL IVOR (GLOVE) ×1 IMPLANT
GOWN STRL REUS W/ TWL XL LVL3 (GOWN DISPOSABLE) ×1 IMPLANT
HOLDER FOLEY CATH W/STRAP (MISCELLANEOUS) IMPLANT
KIT TURNOVER KIT A (KITS) IMPLANT
LOOP CUT BIPOLAR 24F LRG (ELECTROSURGICAL) IMPLANT
MANIFOLD NEPTUNE II (INSTRUMENTS) ×1 IMPLANT
PACK CYSTO (CUSTOM PROCEDURE TRAY) ×1 IMPLANT
SUT ETHILON 3 0 PS 1 (SUTURE) IMPLANT
SYR 30ML LL (SYRINGE) IMPLANT
SYR TOOMEY IRRIG 70ML (MISCELLANEOUS) IMPLANT
SYRINGE TOOMEY IRRIG 70ML (MISCELLANEOUS) IMPLANT
TUBING CONNECTING 10 (TUBING) ×1 IMPLANT
TUBING UROLOGY SET (TUBING) ×1 IMPLANT

## 2023-04-21 NOTE — Anesthesia Postprocedure Evaluation (Signed)
 Anesthesia Post Note  Patient: [name]  Procedure(s) Performed: TRANSURETHRAL RESECTION OF BLADDER TUMOR (TURBT), GEMCITABINE /CYSTOSCOPY WITH RETROGRADE PYLOGRAM (Bladder)     Patient location during evaluation: PACU Anesthesia Type: General Level of consciousness: awake and alert Pain management: pain level controlled Vital Signs Assessment: post-procedure vital signs reviewed and stable Respiratory status: spontaneous breathing, nonlabored ventilation, respiratory function stable and patient connected to nasal cannula oxygen Cardiovascular status: blood pressure returned to baseline and stable Postop Assessment: no apparent nausea or vomiting Anesthetic complications: no   No notable events documented.  Last Vitals:  Vitals:   04/21/23 1200 04/21/23 1207  BP: 134/84 134/87  Pulse: (!) 54 69  Resp: 20 17  Temp:    SpO2: 100% 99%    Last Pain:  Vitals:   04/21/23 1207  TempSrc:   PainSc: 0-No pain                 Younes Degeorge

## 2023-04-21 NOTE — Transfer of Care (Signed)
 Immediate Anesthesia Transfer of Care Note  Patient: Gilbert Clark  Procedure(s) Performed: TRANSURETHRAL RESECTION OF BLADDER TUMOR (TURBT), GEMCITABINE /CYSTOSCOPY WITH RETROGRADE PYLOGRAM (Bladder)  Patient Location: PACU  Anesthesia Type:General  Level of Consciousness: awake and alert   Airway & Oxygen Therapy: Patient Spontanous Breathing and Patient connected to nasal cannula oxygen  Post-op Assessment: Report given to RN and Post -op Vital signs reviewed and stable  Post vital signs: Reviewed and stable  Last Vitals:  Vitals Value Taken Time  BP 145/74 04/21/23 1045  Temp    Pulse 77 04/21/23 1046  Resp 19 04/21/23 1046  SpO2 100 % 04/21/23 1046  Vitals shown include unfiled device data.  Last Pain:  Vitals:   04/21/23 0804  TempSrc: Oral  PainSc:          Complications: No notable events documented.

## 2023-04-21 NOTE — Op Note (Signed)
 Procedure: 1.  Cystoscopy with bilateral retrograde pyelograms and interpretation. 2.  Transurethral resection bladder tumor, 2 to 5 cm, from overlapping sites. 3.  Application of fluoroscopy. 4.  Instillation of gemcitabine  in PACU.  Pre-Op diagnosis: Recurrent bladder cancer.  Postop diagnosis: Same.  Surgeon: Dr. Norleen Seltzer.  Anesthesia: General.  Specimen: Bladder tumor.  Drains: 18 French Foley catheter.  EBL: None.  Complications: None.  Indications: The patient is an 88 year old male with a history of urothelial carcinoma who was found on recent surveillance cystoscopy to have a recurrence of the left trigone and bladder neck and he returns today for resection of the tumor and bilateral retrograde pyelography.  Procedure: He was taken the operating room was given antibiotics.  A general anesthetic was induced.  He was placed in lithotomy position and fitted with PAS hose.  His perineum and genitalia were prepped with Betadine solution was draped in usual sterile fashion.  Cystoscopy was performed using a 21 French scope and 30 degree lens.  Examination revealed a normal urethra.  The external sphincter was intact.  The prostatic urethra was 3 to 4 cm in length with trilobar hyperplasia with a small middle lobe.  Examination of bladder demonstrated moderate to severe trabeculation with some cellules.  The right ureteral orifice was unremarkable.  The left ureteral orifice was initially difficult to locate but that was because there was a papillary tumor on the superior margin of the ureteral orifice.  The papillary tumor was approximately 8 mm in size but there was surrounding abnormal mucosa extending somewhat medially as well as laterally up onto the left lateral wall where a prior resection scar was present.  This mucosal abnormalities is consistent with dysplasia or possible recurrent neoplasm.  After initial inspection of the bladder was performed, bilateral retrograde  pyelograms were performed with a 5 French open-ended catheter and Omnipaque .  The right retrograde pyelogram revealed a normal ureter and intrarenal collecting system without filling defects.  The left retrograde pyelogram also noted a normal ureter and intrarenal collecting system without filling defects.  I then replaced the cystoscope with the continuous-flow resectoscope sheath which I fitted with an Faustine handle, a bipolar loop and the 30 degree lens.  Saline was used as the irrigant.  Initially had to do some minor fulguration at the bladder neck and proximal prostate for some bleeding that occurred from initial cystoscopy.  I then resected the small lesion adjacent to the ureteral orifice and retrieve the specimen.  The surrounding abnormal mucosa was then generously fulgurated with care taken not to fulgurate close to the ureteral orifice which was not resected with the tumor removal.  The area of fulguration was approximately 2 x 3 cm.  Once final inspection revealed no residual abnormal mucosa and no active bleeding, the resectoscope was removed and an 87 French Foley catheter was inserted.  The balloon was filled with 10 mL of sterile fluid.  The catheter was placed to straight drainage.  He was taken down from lithotomy position, his anesthetic was reversed and he was moved recovery room in stable condition.  In the PACU his bladder was instilled with 2000 mg of gemcitabine  and 50 mL of diluent  and the catheter was plugged for 1 hour.  The catheter was then drained and removed.  There were no complications.SABRA

## 2023-04-21 NOTE — H&P (Signed)
 Pt presents today for pre-operative history and physical exam in anticipation of cysto, bilateral retrogrades, TURBT, and possible gemcitobine by Dr. Watt on 04/21/23. He is doing well and is without complaint.   He received cardiac clearance and ok'd to stop Eliquis 3 days pre-op.   Pt denies F/C, HA, CP, SOB, N/V, diarrhea/constipation, back pain, flank pain, hematuria, and dysuria.    HX:    CC: BPH with BOO f/u.   03/13/23: Gilbert Clark returns today in f/u from a Urocuff test for cystoscopy. His prostate volume is . He had a PVR of . The Urocuff was consistent with high pressure/high flow. He remains on tamsulosin and finasteride. He has a history of bladder cancer and is due for cystoscopy today. His IPSS is 23 with nocturia x 4. His PR is today. His UA is clear. he has had no hematuria. He was having some issues with constipation but that has improved.   07/11/22: Gilbert Clark returns today in f/u for his history of BPH with BOO and an elevated PVR. He had a negative surveillance cystoscopy at his last visit and will be due for that in 6 months. He remains on tamsulosin and finasteride and his UA is clear. His PVR is . His IPSS is 19 which is stable. he has nocturia x 3 and has some intermittency. He has had no hematuria or dysuria.   01/12/22: Gilbert Clark returns today in f/u for cystoscopy for his history of bladder cancer with his last bladder tumor resected on 10/21 which was LG NMIBC. His last biopsy in 3/23 was negative. He remains on finasteride and tamsulosin. His IPSS is 23. He has had some increased nocturia 3-4x which he thinks is partially hydration related.   07/02/21: Gilbert Clark returns today in f/u from his recent right ureteroscopy and stent and TURBT. He had a filling defect on the right RTG that proved to be just a calyceal papillary shadow. The lesion on the left posterior wall proved to be inflammatory. He got his stent and foley out without difficult. He remains on  finasteride and tamsulosin for his history of BPH with BOO. He is having some terminal dysuria and a reduced terminal stream.   06/09/21: Gilbert Clark returns today in f/u for his history of bladder cancer with the last TURBT in 10/21 with LG NMIBC on pain and BPH with BOO with an elevated PVR. His PVR is stable at . His IPSS is 16. He remains on finasteride and tamsulosin 0.8mg  daily and his PSA is 3.11 which is appropriately suppressed. His UA is clear. He has had no gross hematuria.   12/09/20: Gilbert Clark returns today in f/u for his history of bladder cancer and BPH with BOO and an elevated PVR. His PVR today is down to . His UA is clear. His IPSS is 18 with nocturia x 4 and some frequency with a sensation of incomplete emptying. He remains on tamsulosin 2 daily. He remains on Eliquis and he has aphasia with some non-specific findings on MRI in 1/22.   06/08/20: Gilbert Clark returns today for surveillance cystoscopy for his history of bladder cancer that was last resected on 01/21/20 and was LG NMIBC. His UA today has 3-10 RBC's. He had post op retention and is on tamsulosin 2 daily. His Prostate was last fall on UA and his PVR today is up to .   01/30/20: Gilbert Clark returns today in f/u. He had LG NMIBC on his path on the recent TURBT on 01/21/20. He had a prior  LG tumor in 2011. He had post op retention despite keeping the foley overnight post op. He had a TOV on 01/24/20 and is voiding with some residual weakness of the stream but that is improving. He remains on tamsulosin 2 daily. He had a negative culture on 01/24/20. He has 3-10 RBC's on UA today. His PVR is today which is stable over the last 6 months.   01/24/2020: Patient had recent TURBT with his primary urologist. A Foley catheter was left at the conclusion of the procedure. He was instructed to remove the catheter a few days after the procedure and when he did so he progressively developed painful inability to void. Seen earlier this  week and had Foley catheter placed. He returns today for trial of void. He continues alpha-blocker therapy in the form of tamsulosin as directed.     ALLERGIES: Augmentin TABS - has never taken the med and is only documented bc his mother told him not to take it EPINEPHrine HCl SOLN Sulfa Drugs    MEDICATIONS: Finasteride 5 mg tablet 1 tablet PO Daily  Levothyroxine Sodium 137 mcg tablet  Tamsulosin Hcl 0.4 mg capsule 2 capsule PO Daily  Crestor 10 MG Oral Tablet Oral  Eliquis 2.5 mg tablet  Furosemide   Prednisone     GU PSH: Bladder Instill AntiCA Agent - 2021 Complex cystometrogram, with voiding pressure studies, any technique - 02/07/2023 Complex Uroflow - 02/07/2023, 2022, 2021 Cystoscopy - 03/13/2023, 01/12/2022, 2023, 2022, 2022, 2021, 2020, 2019 Cystoscopy Fulguration - 2008 Cystoscopy Retrogrades - 2023 Cystoscopy TURBT 2-5 cm - 2023, 2021, 2011 Cystoscopy Ureteroscopy - 2023 Emg surf Electrd - 02/07/2023 Locm 300-399Mg /Ml Iodine,1Ml - 2019       PSH Notes: Cystoscopy With Fulguration Medium Lesion (2-5cm), Cystoscopy With Fulguration, Primary Repair Of Ruptured Achilles Tendon, Bladder Surgery   NON-GU PSH: Cataract surgery Repair Achilles Tendon - 2008 Visit Complexity (formerly GPC1X) - 07/11/2022     GU PMH: Bladder Cancer Posterior, He has an area of probable neoplasm on the posterior wall. He will need this area resected. I have reviewed the risks of bleeding, infection, bladder wall injury, thrombotic events and anesthetic complications. He will need to be cleared to come off of eliquis. He will need possible Gemcitabine  with the added risk of chemical cystitis. He will need Bilateral RTG's. - 03/13/2023, He had LG NMIBC. It has been 10 years since his prior tumor. I don't think he will need BCG at this time. , - 2021, - 2021, He has recurrent tumor on the left posterior bladder wall that appears to be about 5mm and superficial. I will get him set up for a  cystoscopy with bil RTG's, TURBT and instillation of gemcitabine . Risks of bleeding, infection, bladder injury, need for secondary procedures, chemical cystitis, thrombotic events and anesthetic complications reviewed. , - 2021 BPH w/LUTS, He has mod/severe LUTS on maximal medical therapy and an elevated PVR. I think he would be a good candidate for a PAE but need to assess the possible bladder tumor first. - 03/13/2023, - 02/07/2023, I have refilled the tamsulosin and finasteride. His PVR is up to but his symptoms are stable. I will have him return in 6 months for cystoscopy and will get a Urocuff and prostate US  prior so we will get better information about his voiding function. , - 07/11/2022, He is doing well on current therapy which he will continue. , - 01/12/2022, He will stay on the finasteride and tamsulosin 0.8mg  daily. Finasteride refilled.  Dr. Aisha fills the tamsulosin. , - 2023, He is voiding well on current therapy and his PSA is suppressed. , - 2023, I am going to add finasteride and reviewed the side effects. I will get a PSA in 6 months along with a PVR. , - 2022, He has an increased PVR with worsening symptoms and BPH with BOO on cystoscopy but voided ok s/p cystoscopy. I discussed options including the addition of finasteride or consideration of a TURP. His prostate is so I didn't think he was a good candidate for a Urolift or Rezum. He is reasonably content with his symptoms and wants to wait for now and just return in 6 months for cystoscopy. He will continue the tamsulosin 2 daily. , - 2022, He voiding symptoms worsened post op and he had retention but is now improving. His PVR is elevated but stable. I will have him continue the tamsulosin but won't start finasteride because of his memory issues. He will have cystoscopy in 3 months and I will repeat a PVR and get a flowrate then . , - 2021, He will stay on tamsulosin 2 daily. His prostate volume is on US . He may be  interested in Rezum therapy but I will hold off on that until we have the bladder tumor managed. , - 2021, He remains on tamsulosin but never took the finasteride. He seems to be having some memory issues and I will not resend the finasteride since that can aggravate dementia. He has some improvement in the LUTS but has a moderate PVR of . I will recheck that in 6 months but don't think we need to change therapy at this time. , - 2021, Gilbert Clark, - 2014 Incomplete bladder emptying - 03/13/2023, - 02/07/2023, - 07/11/2022 (Stable), PVR . , - 01/12/2022, - 2023, - 2022, PVR was up but I don't think it was a true PVR. He voided s/p cysto with a PF of 36ml/sec. I only instilled about at cystoscopy., - 2022, - 2021, - 2021, - 2021, - 2021 Weak Urinary Stream - 03/13/2023, - 02/07/2023, - 07/11/2022, - 2021, PF is 4ml/sec. , - 2021 Nocturia - 02/07/2023, - 07/11/2022, - 01/12/2022, - 2019 Urinary Frequency - 02/07/2023, - 01/12/2022 History of bladder cancer, He has had no hematuria and his UA is clear. Cystoscopy in 6months. - 07/11/2022, No recurrences. , - 01/12/2022, He is doing well s/p TURBT. His path was Gilbert. He will just need cystoscopy in 6 months., - 2023, No recurrences. He will return in 6 mo for cystoscopy. , - 2022, He has no recurrent tumors. He has stable microhematuria with 3-10 RBC's, - 2022, Bladder Cancer, - 2014 Bladder Cancer Lateral, He has a small recurrence on the left lateral wall and needs Cystoscopy, Bil RTG and TURBT with gemcitabine  instillation. Risks reviewed in detail. - 2023 Elevated PSA - 2023, Elevated prostate specific antigen (PSA), - 2014 Microscopic hematuria - 2022, - 2021, He has 10-20 RBC's. I will get a cytology and see when he last had upper tract imaging. he may need a CT., - 2019 Urinary Retention - 2021 ED due to arterial insufficiency, Erectile dysfunction due to arterial insufficiency - 2014 Other  microscopic hematuria, Microscopic Hematuria - 2014      PMH Notes:  2006-07-27 12:40:16 - Note: Venous Thrombosis   NON-GU PMH: Personal history of other diseases of the digestive system, History of esophageal reflux - 2014 Personal history of other endocrine, nutritional and  metabolic disease, History of hypercholesterolemia - 2014, History of hypothyroidism, - 2014 Personal history of other infectious and parasitic diseases, History of herpes zoster - 2014 DVT, History Encounter for general adult medical examination without abnormal findings, Encounter for preventive health examination Fibromyalgia    FAMILY HISTORY: None   SOCIAL HISTORY: Marital Status: Single Preferred Language: English; Race: White Current Smoking Status: Patient does not smoke anymore.   Tobacco Use Assessment Completed: Used Tobacco in last 30 days? Drinks 1 drink per day.  Drinks 3 caffeinated drinks per day.     Notes: Marital History - Widowed, Death In The Family Father, Death In The Family Mother, Occupation:, Tobacco Use, Alcohol Use, Caffeine Use   REVIEW OF SYSTEMS:    GU Review Male:   Patient denies frequent urination, hard to postpone urination, burning/ pain with urination, get up at night to urinate, leakage of urine, stream starts and stops, trouble starting your stream, have to strain to urinate , erection problems, and penile pain.  Gastrointestinal (Upper):   Patient denies nausea, vomiting, and indigestion/ heartburn.  Gastrointestinal (Lower):   Patient denies diarrhea and constipation.  Constitutional:   Patient denies fever, night sweats, weight loss, and fatigue.  Skin:   Patient denies skin rash/ lesion and itching.  Eyes:   Patient denies blurred vision and double vision.  Ears/ Nose/ Throat:   Patient denies sore throat and sinus problems.  Hematologic/Lymphatic:   Patient denies swollen glands and easy bruising.  Cardiovascular:   Patient denies leg swelling and chest pains.   Respiratory:   Patient denies cough and shortness of breath.  Endocrine:   Patient denies excessive thirst.  Musculoskeletal:   Patient denies back pain and joint pain.  Neurological:   Patient denies headaches and dizziness.  Psychologic:   Patient denies depression and anxiety.   VITAL SIGNS:      04/18/2023 01:13 PM  BP 134/76 mmHg  Pulse 70 /min  Temperature 97.5 F / 36.3 C   MULTI-SYSTEM PHYSICAL EXAMINATION:    Constitutional: Well-nourished. No physical deformities. Normally developed. Good grooming.  Neck: Neck symmetrical, not swollen. Normal tracheal position.  Respiratory: Normal breath sounds. No labored breathing, no use of accessory muscles.   Cardiovascular: Regular rate and rhythm. No murmur, no gallop.   Lymphatic: No enlargement of neck, axillae, groin.  Skin: No paleness, no jaundice, no cyanosis. No lesion, no ulcer, no rash.  Neurologic / Psychiatric: Oriented to time, oriented to place, oriented to person. No depression, no anxiety, no agitation.  Gastrointestinal: No mass, no tenderness, no rigidity, non obese abdomen.  Eyes: Normal conjunctivae. Normal eyelids.  Ears, Nose, Mouth, and Throat: Left ear no scars, no lesions, no masses. Right ear no scars, no lesions, no masses. Nose no scars, no lesions, no masses. Normal hearing. Normal lips.  Musculoskeletal: Normal gait and station of head and neck.     Complexity of Data:  Records Review:   Previous Patient Records  Urine Test Review:   Urinalysis   04/18/23  Urinalysis  Urine Appearance Clear   Urine Color Yellow   Urine Glucose Neg mg/dL  Urine Bilirubin Neg mg/dL  Urine Ketones Neg mg/dL  Urine Specific Gravity 1.020   Urine Blood Trace ery/uL  Urine pH 5.5   Urine Protein 1+ mg/dL  Urine Urobilinogen 1.0 mg/dL  Urine Nitrites Neg   Urine Leukocyte Esterase Trace leu/uL  Urine WBC/hpf 0 - 5/hpf   Urine RBC/hpf 3 - 10/hpf   Urine Epithelial Cells NS (  Not Seen)   Urine Bacteria Rare (0-9/hpf)    Urine Mucous Not Present   Urine Yeast NS (Not Seen)   Urine Trichomonas Not Present   Urine Cystals Amorph Urates   Urine Casts NS (Not Seen)   Urine Sperm Not Present    PROCEDURES:          Urinalysis w/Scope - 81001 Dipstick Dipstick Cont'd Micro  Color: Yellow Bilirubin: Neg mg/dL WBC/hpf: 0 - 5/hpf  Appearance: Clear Ketones: Neg mg/dL RBC/hpf: 3 - 89/yeq  Specific Gravity: 1.020 Blood: Trace ery/uL Bacteria: Rare (0-9/hpf)  pH: 5.5 Protein: 1+ mg/dL Cystals: Amorph Urates  Glucose: Neg mg/dL Urobilinogen: 1.0 mg/dL Casts: NS (Not Seen)    Nitrites: Neg Trichomonas: Not Present    Leukocyte Esterase: Trace leu/uL Mucous: Not Present      Epithelial Cells: NS (Not Seen)      Yeast: NS (Not Seen)      Sperm: Not Present    ASSESSMENT:      ICD-10 Details  1 GU:   Bladder Cancer Lateral - C67.2    PLAN:           Orders Labs Urine Culture          Schedule Return Visit/Planned Activity: Keep Scheduled Appointment - Schedule Surgery          Document Letter(s):  Created for Patient: Clinical Summary         Notes:   There are no changes in the patients history or physical exam since last evaluation by Dr. Watt. Pt is scheduled to undergo cysto, bilateral retrogrades, TURBT, and possible gemcitobine by Dr. Watt on 04/21/23.   Urine for culture.   All pt's questions were answered to the best of my ability.          Next Appointment:      Next Appointment: 04/21/2023 10:00 AM    Appointment Type: Surgery     Location: [facility] [phone]    Provider: Norleen Watt, M.D.    Reason for Visit: OP [facility]--TURBT GEMCITABINE 

## 2023-04-21 NOTE — Discharge Instructions (Addendum)
 You may resume the Eliquis in 48 hours.

## 2023-04-21 NOTE — Anesthesia Procedure Notes (Addendum)
 Procedure Name: Intubation Date/Time: 04/21/2023 9:56 AM  Performed by: Delores Duwaine SAUNDERS, CRNAPre-anesthesia Checklist: Patient identified, Emergency Drugs available, Suction available and Patient being monitored Patient Re-evaluated:Patient Re-evaluated prior to induction Oxygen Delivery Method: Circle System Utilized Preoxygenation: Pre-oxygenation with 100% oxygen Induction Type: IV induction Ventilation: Mask ventilation without difficulty Laryngoscope Size: Mac and 4 Grade View: Grade I Tube type: Oral Tube size: 7.5 mm Number of attempts: 1 Airway Equipment and Method: Stylet and Oral airway Placement Confirmation: ETT inserted through vocal cords under direct vision, positive ETCO2 and breath sounds checked- equal and bilateral Secured at: 23 cm Tube secured with: Tape Dental Injury: Teeth and Oropharynx as per pre-operative assessment

## 2023-04-24 LAB — SURGICAL PATHOLOGY

## 2023-04-25 DIAGNOSIS — R310 Gross hematuria: Secondary | ICD-10-CM | POA: Diagnosis not present

## 2023-04-25 DIAGNOSIS — R3914 Feeling of incomplete bladder emptying: Secondary | ICD-10-CM | POA: Diagnosis not present

## 2023-05-03 DIAGNOSIS — Z8551 Personal history of malignant neoplasm of bladder: Secondary | ICD-10-CM | POA: Diagnosis not present

## 2023-05-03 DIAGNOSIS — R3912 Poor urinary stream: Secondary | ICD-10-CM | POA: Diagnosis not present

## 2023-05-03 DIAGNOSIS — R3914 Feeling of incomplete bladder emptying: Secondary | ICD-10-CM | POA: Diagnosis not present

## 2023-07-03 DIAGNOSIS — S39011A Strain of muscle, fascia and tendon of abdomen, initial encounter: Secondary | ICD-10-CM | POA: Diagnosis not present

## 2023-07-13 DIAGNOSIS — N1831 Chronic kidney disease, stage 3a: Secondary | ICD-10-CM | POA: Diagnosis not present

## 2023-07-13 DIAGNOSIS — H1131 Conjunctival hemorrhage, right eye: Secondary | ICD-10-CM | POA: Diagnosis not present

## 2023-07-13 DIAGNOSIS — E039 Hypothyroidism, unspecified: Secondary | ICD-10-CM | POA: Diagnosis not present

## 2023-07-13 DIAGNOSIS — I4891 Unspecified atrial fibrillation: Secondary | ICD-10-CM | POA: Diagnosis not present

## 2023-07-13 DIAGNOSIS — C679 Malignant neoplasm of bladder, unspecified: Secondary | ICD-10-CM | POA: Diagnosis not present

## 2023-07-13 DIAGNOSIS — I10 Essential (primary) hypertension: Secondary | ICD-10-CM | POA: Diagnosis not present

## 2023-07-13 DIAGNOSIS — E782 Mixed hyperlipidemia: Secondary | ICD-10-CM | POA: Diagnosis not present

## 2023-07-13 DIAGNOSIS — D649 Anemia, unspecified: Secondary | ICD-10-CM | POA: Diagnosis not present

## 2023-07-13 DIAGNOSIS — J310 Chronic rhinitis: Secondary | ICD-10-CM | POA: Diagnosis not present

## 2023-07-13 DIAGNOSIS — Z79899 Other long term (current) drug therapy: Secondary | ICD-10-CM | POA: Diagnosis not present

## 2023-07-17 DIAGNOSIS — E039 Hypothyroidism, unspecified: Secondary | ICD-10-CM | POA: Diagnosis not present

## 2023-08-02 DIAGNOSIS — R3121 Asymptomatic microscopic hematuria: Secondary | ICD-10-CM | POA: Diagnosis not present

## 2023-08-02 DIAGNOSIS — Z8551 Personal history of malignant neoplasm of bladder: Secondary | ICD-10-CM | POA: Diagnosis not present

## 2023-08-03 DIAGNOSIS — E782 Mixed hyperlipidemia: Secondary | ICD-10-CM | POA: Diagnosis not present

## 2023-08-03 DIAGNOSIS — C679 Malignant neoplasm of bladder, unspecified: Secondary | ICD-10-CM | POA: Diagnosis not present

## 2023-08-03 DIAGNOSIS — Z79899 Other long term (current) drug therapy: Secondary | ICD-10-CM | POA: Diagnosis not present

## 2023-08-03 DIAGNOSIS — K5900 Constipation, unspecified: Secondary | ICD-10-CM | POA: Diagnosis not present

## 2023-08-03 DIAGNOSIS — E039 Hypothyroidism, unspecified: Secondary | ICD-10-CM | POA: Diagnosis not present

## 2023-08-03 DIAGNOSIS — I4891 Unspecified atrial fibrillation: Secondary | ICD-10-CM | POA: Diagnosis not present

## 2023-08-03 DIAGNOSIS — I10 Essential (primary) hypertension: Secondary | ICD-10-CM | POA: Diagnosis not present

## 2023-08-03 DIAGNOSIS — J310 Chronic rhinitis: Secondary | ICD-10-CM | POA: Diagnosis not present

## 2023-08-21 DIAGNOSIS — M545 Low back pain, unspecified: Secondary | ICD-10-CM | POA: Diagnosis not present

## 2023-10-09 DIAGNOSIS — R143 Flatulence: Secondary | ICD-10-CM | POA: Diagnosis not present

## 2023-10-09 DIAGNOSIS — R197 Diarrhea, unspecified: Secondary | ICD-10-CM | POA: Diagnosis not present

## 2023-10-09 DIAGNOSIS — E039 Hypothyroidism, unspecified: Secondary | ICD-10-CM | POA: Diagnosis not present

## 2023-10-09 DIAGNOSIS — E782 Mixed hyperlipidemia: Secondary | ICD-10-CM | POA: Diagnosis not present

## 2023-10-09 DIAGNOSIS — D649 Anemia, unspecified: Secondary | ICD-10-CM | POA: Diagnosis not present

## 2023-11-22 DIAGNOSIS — Z0000 Encounter for general adult medical examination without abnormal findings: Secondary | ICD-10-CM | POA: Diagnosis not present

## 2024-01-15 DIAGNOSIS — R4701 Aphasia: Secondary | ICD-10-CM | POA: Diagnosis not present

## 2024-01-15 DIAGNOSIS — I4891 Unspecified atrial fibrillation: Secondary | ICD-10-CM | POA: Diagnosis not present

## 2024-01-15 DIAGNOSIS — E782 Mixed hyperlipidemia: Secondary | ICD-10-CM | POA: Diagnosis not present

## 2024-01-15 DIAGNOSIS — Z79899 Other long term (current) drug therapy: Secondary | ICD-10-CM | POA: Diagnosis not present

## 2024-01-15 DIAGNOSIS — E039 Hypothyroidism, unspecified: Secondary | ICD-10-CM | POA: Diagnosis not present

## 2024-01-15 DIAGNOSIS — Z23 Encounter for immunization: Secondary | ICD-10-CM | POA: Diagnosis not present

## 2024-01-15 DIAGNOSIS — C679 Malignant neoplasm of bladder, unspecified: Secondary | ICD-10-CM | POA: Diagnosis not present

## 2024-02-05 DIAGNOSIS — Z8551 Personal history of malignant neoplasm of bladder: Secondary | ICD-10-CM | POA: Diagnosis not present

## 2024-02-05 DIAGNOSIS — R3912 Poor urinary stream: Secondary | ICD-10-CM | POA: Diagnosis not present

## 2024-02-16 ENCOUNTER — Other Ambulatory Visit: Payer: Self-pay | Admitting: Urology

## 2024-02-16 ENCOUNTER — Telehealth: Payer: Self-pay | Admitting: Internal Medicine

## 2024-02-16 NOTE — Telephone Encounter (Signed)
   Pre-operative Risk Assessment    Patient Name: Gilbert Clark  DOB: Oct 07, 1935 MRN: [id number]   Date of last office visit: 07/05/22 Date of next office visit: TBD   Request for Surgical Clearance    Procedure:  Resection of bladder tumor  Date of Surgery:  Clearance 03/22/24                                Surgeon:  Norleen Seltzer Surgeon's Group or Practice Name:  [facility]  Phone number:  [phone] Fax number:  [phone]   Type of Clearance Requested:   - Medical  - Pharmacy:  Hold Apixaban (Eliquis) 2   Type of Anesthesia:  General    Additional requests/questions:    Bonney Larraine Salt   02/16/2024, 9:49 AM

## 2024-02-16 NOTE — Telephone Encounter (Signed)
 I s/w the pt and he said he has aphasia and if I could one of his daughters.   I the called and s/w Arland Idol and stated my reason for calling. Arland scheduled in office appt  02/28/24 with Jon Hails, [facility] @ 8:50. Arland has been given the address to [address]  Arland did also state to make her # as primary as her dad is having a hard time with speech.

## 2024-02-16 NOTE — Telephone Encounter (Signed)
 Primary Cardiologist:Mahesh A Chandrasekhar, MD  Chart reviewed as part of pre-operative protocol coverage. Because of Gilbert Clark's past medical history and time since last visit, Gilbert Clark will require a follow-up visit in order to better assess preoperative cardiovascular risk.  Pre-op covering staff: - Please schedule appointment and call patient to inform them. - Please contact requesting surgeon's office via preferred method (i.e, phone, fax) to inform them of need for appointment prior to surgery.  This message will also be routed to pharmacy pool for input on holding anticoagulant agent as requested below so that this information is available at time of patient's appointment.   Rosaline EMERSON Bane, NP-C  02/16/2024, 10:22 AM [address], [address] [place], [place] [address] Office [phone] Fax [phone]

## 2024-02-22 NOTE — Progress Notes (Unsigned)
 Cardiology Office Note:    Date:  02/22/2024   ID:  Gilbert Clark, DOB [birth year], MRN [id number]  PCP:  Gilbert Harvey, MD   [facility] Providers Cardiologist:  Gilbert DELENA Leavens, MD { Click to update primary MD,subspecialty MD or APP then REFRESH:1}    Referring MD: Gilbert Harvey, MD   No chief complaint on file. ***  History of Present Illness:    Gilbert Clark is a 88 y.o. male with a hx of PAF, LBBB, HTN, hypothyroidism. He has a history of provoked PE after achilles surgery 15 years ago. Hx of PMR without Giant Cell Arteritis. He has baseline aphasia, not stroke related.   Echo 12/2021 with LVEF 50-55% with grade 1 DD, moderate MAC, no significant valvular disease. Heart monitor 01/2022 demonstrated asymptomatic bradycardia, PAF.   He was last seen 06/2022 and was doing well.   He presents for preoperative risk evaluation prior to bladder tumor resection.      Atrial fibrillation - paroxysmal Hx of PE - provoked - on 5 mg eliquis BID - watch creatinine -    LBBB with first degree HB - no syncope    Preoperative risk evaluation prior to bladder tumor resection According to the [facility], he has a     Per our clinical pharmacist:     Past Medical History:  Diagnosis Date   Anemia    Aphasia    Atrial fibrillation (HCC)    Benign prostatic hyperplasia with lower urinary tract symptoms    Bladder cancer (HCC)    BPH (benign prostatic hyperplasia)    Constipation    Dementia (HCC)    Fibromyalgia    GERD (gastroesophageal reflux disease)    Headache    High risk medication use    History of adenomatous polyp of colon    Hx of blood clots    Hyperlipidemia    Hypothyroidism    Memory loss    Polymyalgia rheumatica    Pulmonary embolism (HCC)    hx of     Past Surgical History:  Procedure Laterality Date   ACHILLES TENDON REPAIR  2004   bilateral cataract surgery   2020   CYSTOSCOPY W/ RETROGRADES Bilateral 01/21/2020    Procedure: CYSTOSCOPY WITH BILATERAL RETROGRADE;  Surgeon: Gilbert Rush, MD;  Location: [facility];  Service: Urology;  Laterality: Bilateral;   CYSTOSCOPY WITH URETEROSCOPY AND STENT PLACEMENT Right 06/25/2021   Procedure: URETEROSCOPY AND STENT PLACEMENT;  Surgeon: Gilbert Rush, MD;  Location: [facility];  Service: Urology;  Laterality: Right;   IVC FILTER INSERTION      2000   TRANSURETHRAL RESECTION OF BLADDER TUMOR WITH GYRUS (TURBT-GYRUS)     multiple times    TRANSURETHRAL RESECTION OF BLADDER TUMOR WITH MITOMYCIN -C N/A 01/21/2020   Procedure: TRANSURETHRAL RESECTION OF BLADDER TUMOR WITH GEMCITABINE ;  Surgeon: Gilbert Rush, MD;  Location: [facility];  Service: Urology;  Laterality: N/A;   TRANSURETHRAL RESECTION OF BLADDER TUMOR WITH MITOMYCIN -C Bilateral 06/25/2021   Procedure: CYSTOSCOPY BILATERAL RETROGRADES TRANSURETHRAL RESECTION OF BLADDER TUMOR;  Surgeon: Gilbert Rush, MD;  Location: [facility];  Service: Urology;  Laterality: Bilateral;    Current Medications: No outpatient medications have been marked as taking for the 02/28/24 encounter (Appointment) with Gilbert Gilbert Garre, PA.     Allergies:   Augmentin [amoxicillin-pot clavulanate], Epinephrine, and Sulfa antibiotics   Social History   Socioeconomic History   Marital status: Widowed    Spouse name: Not on file  Number of children: 2   Years of education: 14   Highest education level: Some college, no degree  Occupational History    Comment: retired from airline pilot, HVAC  Tobacco Use   Smoking status: Former    Current packs/day: 0.00    Types: Cigarettes    Quit date: 04/18/1962    Years since quitting: 61.8   Smokeless tobacco: Never  Vaping Use   Vaping status: Never Used  Substance and Sexual Activity   Alcohol use: Not Currently    Comment: 05/27/20 1 beer weekly   Drug use: Never   Sexual activity: Not on file  Other Topics Concern   Not on file  Social History Narrative   05/27/20  lives alone   Social Drivers of Health   Financial Resource Strain: Not on file  Food Insecurity: Not on file  Transportation Needs: Not on file  Physical Activity: Not on file  Stress: Not on file  Social Connections: Not on file     Family History: The patient's ***family history includes Appendicitis in his father; Breast cancer in his sister.  ROS:   Please see the history of present illness.    *** All other systems reviewed and are negative.  EKGs/Labs/Other Studies Reviewed:    The following studies were reviewed today: ***      Recent Labs: 04/18/2023: BUN 27; Creatinine, Ser 1.44; Hemoglobin 13.7; Platelets 168; Potassium 4.4; Sodium 140  Recent Lipid Panel No results found for: CHOL, TRIG, HDL, CHOLHDL, VLDL, LDLCALC, LDLDIRECT   Risk Assessment/Calculations:   {Does this patient have ATRIAL FIBRILLATION?:[id number]}  No BP recorded.  {Refresh Note OR Click here to enter BP  :1}***         Physical Exam:    VS:  There were no vitals taken for this visit.    Wt Readings from Last 3 Encounters:  04/21/23 198 lb (89.8 kg)  04/18/23 198 lb (89.8 kg)  07/05/22 206 lb 3.2 oz (93.5 kg)     GEN: *** Well nourished, well developed in no acute distress HEENT: Normal NECK: No JVD; No carotid bruits LYMPHATICS: No lymphadenopathy CARDIAC: ***RRR, no murmurs, rubs, gallops RESPIRATORY:  Clear to auscultation without rales, wheezing or rhonchi  ABDOMEN: Soft, non-tender, non-distended MUSCULOSKELETAL:  No edema; No deformity  SKIN: Warm and dry NEUROLOGIC:  Alert and oriented x 3 PSYCHIATRIC:  Normal affect   ASSESSMENT:    No diagnosis found. PLAN:    In order of problems listed above:  ***      {Are you ordering a CV Procedure (e.g. stress test, cath, DCCV, TEE, etc)?   Press F2        :[id number]}    Medication Adjustments/Labs and Tests Ordered: Current medicines are reviewed at length with the patient today.  Concerns regarding  medicines are outlined above.  No orders of the defined types were placed in this encounter.  No orders of the defined types were placed in this encounter.   There are no Patient Instructions on file for this visit.   Signed, Gilbert Nat Hails, PA  02/22/2024 4:07 PM    [facility]

## 2024-02-26 NOTE — Telephone Encounter (Signed)
 Patient with diagnosis of Afib on Eliquis for anticoagulation.   Hx of provoked PE after Achilles surgery 15 years ago (had IVC filter or unclear etiology).  Procedure:  Resection of bladder tumor  Date of procedure: 03/22/2024   CHA2DS2-VASc Score = 3   This indicates a 3.2% annual risk of stroke. The patient's score is based upon: CHF History: 0 HTN History: 0 Diabetes History: 0 Stroke History: 0 Vascular Disease History: 1 Age Score: 2 Gender Score: 0  CrCl need updated lab  Platelet count need updated lab   Patient has not had an Afib/aflutter ablation in the last 3 months, DCCV within the last 4 weeks or a watchman implanted in the last 45 days     Per office protocol, patient can hold Eliquis for -- days prior to procedure.   Patient will not need bridging with Lovenox (enoxaparin) around procedure.  **This guidance is not considered finalized until pre-operative APP has relayed final recommendations.**

## 2024-02-26 NOTE — Telephone Encounter (Signed)
 Pt has an in office appt scheduled for 02/28/24 will add need for lab to notes for that visit.

## 2024-02-28 ENCOUNTER — Encounter: Payer: Self-pay | Admitting: Physician Assistant

## 2024-02-28 ENCOUNTER — Ambulatory Visit: Attending: Physician Assistant | Admitting: Physician Assistant

## 2024-02-28 VITALS — BP 132/72 | HR 64 | Ht 72.0 in | Wt 206.0 lb

## 2024-02-28 DIAGNOSIS — Z01810 Encounter for preprocedural cardiovascular examination: Secondary | ICD-10-CM | POA: Diagnosis not present

## 2024-02-28 DIAGNOSIS — I447 Left bundle-branch block, unspecified: Secondary | ICD-10-CM

## 2024-02-28 DIAGNOSIS — Z7901 Long term (current) use of anticoagulants: Secondary | ICD-10-CM

## 2024-02-28 DIAGNOSIS — I480 Paroxysmal atrial fibrillation: Secondary | ICD-10-CM

## 2024-02-28 DIAGNOSIS — R600 Localized edema: Secondary | ICD-10-CM

## 2024-02-28 DIAGNOSIS — Z86711 Personal history of pulmonary embolism: Secondary | ICD-10-CM

## 2024-02-28 NOTE — Patient Instructions (Signed)
 Medication Instructions:  Your physician recommends that you continue on your current medications as directed. Please refer to the Current Medication list given to you today.  *If you need a refill on your cardiac medications before your next appointment, please call your pharmacy*  Lab Work: Today- CBC, BMET If you have labs (blood work) drawn today and your tests are completely normal, you will receive your results only by: MyChart Message (if you have MyChart) OR A paper copy in the mail If you have any lab test that is abnormal or we need to change your treatment, we will call you to review the results.  TYour next appointment:   1 year(s)  Provider:   Stanly DELENA Leavens, MD

## 2024-02-28 NOTE — Telephone Encounter (Addendum)
 Patient with diagnosis of Afib on Eliquis for anticoagulation.   Hx of provoked PE after Achilles surgery 15 years ago (had IVC filter or unclear etiology).  Procedure:  Resection of bladder tumor  Date of procedure: 03/22/2024     CHA2DS2-VASc Score = 3   This indicates a 3.2% annual risk of stroke. The patient's score is based upon: CHF History: 0 HTN History: 0 Diabetes History: 0 Stroke History: 0 Vascular Disease History: 1 Age Score: 2 Gender Score: 0   CrCl need 49 ml/min Platelet count 186K   Patient has not had an Afib/aflutter ablation in the last 3 months, DCCV within the last 4 weeks or a watchman implanted in the last 45 days        Per office protocol, patient can hold Eliquis for 3 days prior to procedure.   Patient will not need bridging with Lovenox (enoxaparin) around procedure.   **This guidance is not considered finalized until pre-operative APP has relayed final recommendations.**

## 2024-02-29 ENCOUNTER — Ambulatory Visit: Payer: Self-pay | Admitting: Physician Assistant

## 2024-02-29 LAB — CBC
Hematocrit: 42.7 % (ref 37.5–51.0)
Hemoglobin: 14 g/dL (ref 13.0–17.7)
MCH: 30.2 pg (ref 26.6–33.0)
MCHC: 32.8 g/dL (ref 31.5–35.7)
MCV: 92 fL (ref 79–97)
Platelets: 143 x10E3/uL — ABNORMAL LOW (ref 150–450)
RBC: 4.63 x10E6/uL (ref 4.14–5.80)
RDW: 13 % (ref 11.6–15.4)
WBC: 6 x10E3/uL (ref 3.4–10.8)

## 2024-02-29 LAB — BASIC METABOLIC PANEL WITH GFR
BUN/Creatinine Ratio: 16 (ref 10–24)
BUN: 22 mg/dL (ref 8–27)
CO2: 23 mmol/L (ref 20–29)
Calcium: 9.7 mg/dL (ref 8.6–10.2)
Chloride: 104 mmol/L (ref 96–106)
Creatinine, Ser: 1.34 mg/dL — ABNORMAL HIGH (ref 0.76–1.27)
Glucose: 101 mg/dL — ABNORMAL HIGH (ref 70–99)
Potassium: 4.6 mmol/L (ref 3.5–5.2)
Sodium: 142 mmol/L (ref 134–144)
eGFR: 52 mL/min/1.73 — ABNORMAL LOW (ref >59)

## 2024-03-20 ENCOUNTER — Encounter (HOSPITAL_COMMUNITY): Payer: Self-pay | Admitting: Urology

## 2024-03-20 NOTE — Progress Notes (Signed)
 Cardiology clearance dated 02/28/24 by A. Duke, PA in [place].   Spoke w/ via phone for pre-op interview--- Zell and daughter Arland Malm needs dos----BMP per anesthesia.         Lab results------Current EKG in Epic dated 02/28/24. COVID test -----patient states asymptomatic no test needed Arrive at -------0530 NPO after MN NO Solid Food.   Pre-Surgery Ensure or G2:  Med rec completed Medications to take morning of surgery -----Levothyroxine Diabetic medication -----  GLP1 agonist last dose: GLP1 instructions:  Patient instructed no nail polish to be worn day of surgery Patient instructed to bring photo id and insurance card day of surgery Patient aware to have Driver (ride ) / caregiver    for 24 hours after surgery - Daughter Arland Idol Patient Special Instructions -----Hold Eliquis 3 days prior to procedure per cardiology note.  Pre-Op special Instructions -----Daughter needs to come to preop with pt to answer questions, pt HOH.  Patient verbalized understanding of instructions that were given at this phone interview. Patient denies chest pain, sob, fever, cough at the interview.

## 2024-03-20 NOTE — Telephone Encounter (Signed)
 Patient daughter is returning a call from nurse to discuss patient lab results

## 2024-03-21 ENCOUNTER — Encounter (HOSPITAL_COMMUNITY): Payer: Self-pay | Admitting: Urology

## 2024-03-21 NOTE — Anesthesia Preprocedure Evaluation (Addendum)
 Anesthesia Evaluation  Patient identified by MRN, date of birth, ID band Patient awake    Reviewed: Allergy & Precautions, NPO status , Patient's Chart, lab work & pertinent test results  Airway Mallampati: II  TM Distance: >3 FB     Dental no notable dental hx. (+) Caps, Dental Advisory Given   Pulmonary former smoker, PE   Pulmonary exam normal breath sounds clear to auscultation       Cardiovascular Normal cardiovascular exam+ dysrhythmias Atrial Fibrillation  Rhythm:Irregular Rate:Normal  EKG 02/28/24 Atrial fibrillation, LAD, Anteroseptal MI  Echo 01/13/22 1. Left ventricular ejection fraction, by estimation, is 50 to 55%. The  left ventricle has low normal function. The left ventricle has no regional  wall motion abnormalities. Left ventricular diastolic parameters are  consistent with Grade I diastolic  dysfunction (impaired relaxation). The average left ventricular global  longitudinal strain is -22.3 %. The global longitudinal strain is normal.   2. Right ventricular systolic function is normal. The right ventricular  size is normal. Tricuspid regurgitation signal is inadequate for assessing  PA pressure.   3. The mitral valve is normal in structure. No evidence of mitral valve  regurgitation. No evidence of mitral stenosis. Moderate mitral annular  calcification.   4. The aortic valve is tricuspid. Aortic valve regurgitation is not  visualized. Aortic valve sclerosis is present, with no evidence of aortic  valve stenosis.   5. The inferior vena cava is normal in size with greater than 50%     Neuro/Psych  Headaches PSYCHIATRIC DISORDERS     Dementia Aphasia  Neuromuscular disease    GI/Hepatic Neg liver ROS,GERD  ,,  Endo/Other  Hypothyroidism    Renal/GU Renal InsufficiencyRenal diseaseLab Results      Component                Value               Date                      NA                       142                  02/28/2024                CL                       104                 02/28/2024                K                        4.6                 02/28/2024                CO2                      23                  02/28/2024                BUN  22                  02/28/2024                CREATININE               1.34 (H)            02/28/2024                EGFR                     52 (L)              02/28/2024                CALCIUM                  9.7                 02/28/2024                GLUCOSE                  101 (H)             02/28/2024              Bladder Ca BPH    Musculoskeletal  (+)  Fibromyalgia -Polymyalgia rheumatica Thrombocytopenia   Abdominal   Peds  Hematology  (+) Blood dyscrasia, anemia Lab Results      Component                Value               Date                      WBC                      6.0                 02/28/2024                HGB                      14.0                02/28/2024                HCT                      42.7                02/28/2024                MCV                      92                  02/28/2024                PLT                      143 (L)             02/28/2024           Eliquis therapy last dose 12/3   Anesthesia Other Findings   Reproductive/Obstetrics  Anesthesia Physical Anesthesia Plan  ASA: 3  Anesthesia Plan: General   Post-op Pain Management: Minimal or no pain anticipated   Induction: Intravenous  PONV Risk Score and Plan: 4 or greater and Treatment may vary due to age or medical condition, Propofol  infusion, TIVA and Ondansetron   Airway Management Planned: LMA  Additional Equipment: None  Intra-op Plan:   Post-operative Plan: Extubation in OR  Informed Consent: I have reviewed the patients History and Physical, chart, labs and discussed the procedure including the risks, benefits and alternatives  for the proposed anesthesia with the patient or authorized representative who has indicated his/her understanding and acceptance.     Dental advisory given  Plan Discussed with: CRNA and Anesthesiologist  Anesthesia Plan Comments:          Anesthesia Quick Evaluation

## 2024-03-22 ENCOUNTER — Ambulatory Visit (HOSPITAL_COMMUNITY)

## 2024-03-22 ENCOUNTER — Ambulatory Visit (HOSPITAL_COMMUNITY): Admitting: Anesthesiology

## 2024-03-22 ENCOUNTER — Encounter (HOSPITAL_COMMUNITY): Admission: RE | Disposition: A | Payer: Self-pay | Source: Home / Self Care | Attending: Urology

## 2024-03-22 ENCOUNTER — Ambulatory Visit (HOSPITAL_COMMUNITY): Admission: RE | Admit: 2024-03-22 | Discharge: 2024-03-22 | Disposition: A | Attending: Urology | Admitting: Urology

## 2024-03-22 ENCOUNTER — Encounter (HOSPITAL_COMMUNITY): Payer: Self-pay | Admitting: Urology

## 2024-03-22 DIAGNOSIS — R030 Elevated blood-pressure reading, without diagnosis of hypertension: Secondary | ICD-10-CM

## 2024-03-22 HISTORY — PX: CYSTOSCOPY W/ RETROGRADES: SHX1426

## 2024-03-22 HISTORY — PX: BLADDER INSTILLATION: SHX6893

## 2024-03-22 LAB — BASIC METABOLIC PANEL WITH GFR
Anion gap: 8 (ref 5–15)
BUN: 27 mg/dL — ABNORMAL HIGH (ref 8–23)
CO2: 23 mmol/L (ref 22–32)
Calcium: 9.2 mg/dL (ref 8.9–10.3)
Chloride: 110 mmol/L (ref 98–111)
Creatinine, Ser: 1.17 mg/dL (ref 0.61–1.24)
GFR, Estimated: >60 mL/min (ref >60)
Glucose, Bld: 101 mg/dL — ABNORMAL HIGH (ref 70–99)
Potassium: 4 mmol/L (ref 3.5–5.1)
Sodium: 141 mmol/L (ref 135–145)

## 2024-03-22 SURGERY — TURBT, WITH CHEMOTHERAPEUTIC AGENT INSTILLATION INTO BLADDER
Anesthesia: General

## 2024-03-22 MED ORDER — ACETAMINOPHEN 325 MG PO TABS: 650.0000 mg | ORAL_TABLET | ORAL | Status: DC | PRN

## 2024-03-22 MED ORDER — CHLORHEXIDINE GLUCONATE 0.12 % MT SOLN
OROMUCOSAL | Status: DC
  Filled 2024-03-22: qty 15

## 2024-03-22 MED ORDER — SODIUM CHLORIDE 0.9 % IV SOLN: 250.0000 mL | INTRAVENOUS | Status: DC | PRN

## 2024-03-22 MED ORDER — OXYCODONE HCL 5 MG PO TABS: 5.0000 mg | ORAL_TABLET | ORAL | Status: DC | PRN

## 2024-03-22 MED ORDER — CHLORHEXIDINE GLUCONATE 0.12 % MT SOLN
15.0000 mL | Freq: Once | OROMUCOSAL | Status: AC
  Administered 2024-03-22: 15 mL via OROMUCOSAL

## 2024-03-22 MED ORDER — OXYCODONE HCL 5 MG/5ML PO SOLN: 5.0000 mg | Freq: Once | ORAL | Status: DC | PRN

## 2024-03-22 MED ORDER — CEFAZOLIN SODIUM-DEXTROSE 2-4 GM/100ML-% IV SOLN
INTRAVENOUS | Status: AC
  Filled 2024-03-22: qty 100

## 2024-03-22 MED ORDER — GEMCITABINE CHEMO FOR BLADDER INSTILLATION 2000 MG
INTRAVENOUS | Status: DC | PRN
  Administered 2024-03-22: 2000 mg via INTRAVESICAL

## 2024-03-22 MED ORDER — PROPOFOL 10 MG/ML IV BOLUS
INTRAVENOUS | Status: DC | PRN
  Administered 2024-03-22: 150 mg via INTRAVENOUS

## 2024-03-22 MED ORDER — SODIUM CHLORIDE 0.9% FLUSH: 3.0000 mL | INTRAVENOUS | Status: DC | PRN

## 2024-03-22 MED ORDER — ONDANSETRON HCL 4 MG/2ML IJ SOLN
INTRAMUSCULAR | Status: DC | PRN
  Administered 2024-03-22: 4 mg via INTRAVENOUS

## 2024-03-22 MED ORDER — ACETAMINOPHEN 650 MG RE SUPP: 650.0000 mg | RECTAL | Status: DC | PRN

## 2024-03-22 MED ORDER — PHENYLEPHRINE 80 MCG/ML (10ML) SYRINGE FOR IV PUSH (FOR BLOOD PRESSURE SUPPORT)
PREFILLED_SYRINGE | INTRAVENOUS | Status: DC | PRN
  Administered 2024-03-22: 160 ug via INTRAVENOUS

## 2024-03-22 MED ORDER — MORPHINE SULFATE (PF) 2 MG/ML IV SOLN: 2.0000 mg | INTRAVENOUS | Status: DC | PRN

## 2024-03-22 MED ORDER — PROPOFOL 10 MG/ML IV BOLUS
INTRAVENOUS | Status: AC
  Filled 2024-03-22: qty 20

## 2024-03-22 MED ORDER — LACTATED RINGERS IV SOLN: INTRAVENOUS | Status: DC | PRN

## 2024-03-22 MED ORDER — PROPOFOL 500 MG/50ML IV EMUL
INTRAVENOUS | Status: DC | PRN
  Administered 2024-03-22: 180 ug/kg/min via INTRAVENOUS

## 2024-03-22 MED ORDER — SODIUM CHLORIDE 0.9 % IR SOLN
Status: DC | PRN
  Administered 2024-03-22: 3000 mL via INTRAVESICAL

## 2024-03-22 MED ORDER — FENTANYL CITRATE (PF) 100 MCG/2ML IJ SOLN: 25.0000 ug | INTRAMUSCULAR | Status: DC | PRN

## 2024-03-22 MED ORDER — FENTANYL CITRATE (PF) 100 MCG/2ML IJ SOLN
INTRAMUSCULAR | Status: AC
  Filled 2024-03-22: qty 2

## 2024-03-22 MED ORDER — SODIUM CHLORIDE 0.9% FLUSH: 3.0000 mL | Freq: Two times a day (BID) | INTRAVENOUS | Status: DC

## 2024-03-22 MED ORDER — FENTANYL CITRATE (PF) 250 MCG/5ML IJ SOLN
INTRAMUSCULAR | Status: DC | PRN
  Administered 2024-03-22: 50 ug via INTRAVENOUS

## 2024-03-22 MED ORDER — LIDOCAINE HCL URETHRAL/MUCOSAL 2 % EX GEL
CUTANEOUS | Status: AC
  Filled 2024-03-22: qty 11

## 2024-03-22 MED ORDER — PHENYLEPHRINE HCL-NACL 20-0.9 MG/250ML-% IV SOLN
INTRAVENOUS | Status: DC | PRN
  Administered 2024-03-22: 30 ug/min via INTRAVENOUS

## 2024-03-22 MED ORDER — OXYCODONE HCL 5 MG PO TABS: 5.0000 mg | ORAL_TABLET | Freq: Once | ORAL | Status: DC | PRN

## 2024-03-22 MED ORDER — LIDOCAINE 2% (20 MG/ML) 5 ML SYRINGE
INTRAMUSCULAR | Status: DC | PRN
  Administered 2024-03-22: 40 mg via INTRAVENOUS

## 2024-03-22 MED ORDER — IOHEXOL 300 MG/ML  SOLN
INTRAMUSCULAR | Status: DC | PRN
  Administered 2024-03-22: 19 mL via URETHRAL

## 2024-03-22 MED ORDER — DEXAMETHASONE SOD PHOSPHATE PF 10 MG/ML IJ SOLN
INTRAMUSCULAR | Status: DC | PRN
  Administered 2024-03-22: 10 mg via INTRAVENOUS

## 2024-03-22 MED ORDER — CEFAZOLIN SODIUM-DEXTROSE 2-4 GM/100ML-% IV SOLN
2.0000 g | INTRAVENOUS | Status: AC
  Administered 2024-03-22: 2 g via INTRAVENOUS

## 2024-03-22 MED ORDER — ORAL CARE MOUTH RINSE: 15.0000 mL | Freq: Once | OROMUCOSAL | Status: AC

## 2024-03-22 MED ORDER — LACTATED RINGERS IV SOLN: INTRAVENOUS | Status: DC

## 2024-03-22 SURGICAL SUPPLY — 31 items
BAG DRAIN URO-CYSTO SKYTR STRL (DRAIN) ×2 IMPLANT
BAG URINE DRAIN 2000ML AR STRL (UROLOGICAL SUPPLIES) ×2 IMPLANT
BASKET ZERO TIP NITINOL 2.4FR (BASKET) IMPLANT
CATH FOLEY 2WAY SLVR 5CC 18FR (CATHETERS) IMPLANT
CATH FOLEY 3WAY 30CC 22FR (CATHETERS) IMPLANT
CATH HEMA 3WAY 30CC 22FR COUDE (CATHETERS) IMPLANT
CATH URETL OPEN 5X70 (CATHETERS) IMPLANT
ELECTRODE REM PT RTRN 9FT ADLT (ELECTROSURGICAL) IMPLANT
EXTRACTOR STONE 1.7FRX115CM (UROLOGICAL SUPPLIES) IMPLANT
GLOVE SURG SS PI 8.0 STRL IVOR (GLOVE) ×2 IMPLANT
GOWN STRL REUS W/ TWL XL LVL3 (GOWN DISPOSABLE) ×2 IMPLANT
GOWN STRL SURGICAL XL XLNG (GOWN DISPOSABLE) ×2 IMPLANT
GUIDEWIRE ANG ZIPWIRE 038X150 (WIRE) IMPLANT
GUIDEWIRE STR DUAL SENSOR (WIRE) ×2 IMPLANT
HOLDER FOLEY CATH W/STRAP (MISCELLANEOUS) ×2 IMPLANT
KIT TURNOVER KIT B (KITS) ×2 IMPLANT
LASER FIB FLEXIVA PULSE ID 365 (Laser) IMPLANT
LOOP CUT BIPOLAR 24F LRG (ELECTROSURGICAL) ×2 IMPLANT
MANIFOLD NEPTUNE II (INSTRUMENTS) ×2 IMPLANT
PACK CYSTO (CUSTOM PROCEDURE TRAY) ×2 IMPLANT
PLUG CATH AND CAP STRL 200 (CATHETERS) IMPLANT
SHEATH NAVIGATOR HD 11/13X28 (SHEATH) IMPLANT
SHEATH NAVIGATOR HD 12/14X46 (SHEATH) IMPLANT
SLEEVE SCD COMPRESS KNEE MED (STOCKING) ×2 IMPLANT
SOL .9 NS 3000ML IRR UROMATIC (IV SOLUTION) ×4 IMPLANT
SYR 30ML LL (SYRINGE) IMPLANT
SYR CONTROL 10ML LL (SYRINGE) IMPLANT
SYRINGE TOOMEY IRRIG 70ML (MISCELLANEOUS) IMPLANT
TUBE CONNECTING 12X1/4 (SUCTIONS) ×2 IMPLANT
TUBING UROLOGY SET (TUBING) IMPLANT
WATER STERILE IRR 3000ML UROMA (IV SOLUTION) IMPLANT

## 2024-03-22 NOTE — Anesthesia Procedure Notes (Signed)
 Procedure Name: LMA Insertion Date/Time: 03/22/2024 7:35 AM  Performed by: Scherrie Mast, CRNAPre-anesthesia Checklist: Patient identified, Emergency Drugs available, Suction available and Patient being monitored Patient Re-evaluated:Patient Re-evaluated prior to induction Oxygen Delivery Method: Circle System Utilized Preoxygenation: Pre-oxygenation with 100% oxygen Induction Type: IV induction Ventilation: Mask ventilation without difficulty LMA: LMA inserted LMA Size: 5.0 Number of attempts: 1 Airway Equipment and Method: Bite block Placement Confirmation: positive ETCO2 Tube secured with: Tape Dental Injury: Teeth and Oropharynx as per pre-operative assessment

## 2024-03-22 NOTE — Transfer of Care (Signed)
 Immediate Anesthesia Transfer of Care Note  Patient: Gilbert Clark  Procedure(s) Performed: TURBT, WITH CHEMOTHERAPEUTIC AGENT INSTILLATION INTO BLADDER INSTILLATION, BLADDER CYSTOSCOPY, WITH RETROGRADE PYELOGRAM (Bilateral)  Patient Location: PACU  Anesthesia Type:General  Level of Consciousness: awake, alert , and oriented  Airway & Oxygen Therapy: Patient Spontanous Breathing and Patient connected to nasal cannula oxygen  Post-op Assessment: Report given to RN and Post -op Vital signs reviewed and stable  Post vital signs: Reviewed and stable  Last Vitals:  Vitals Value Taken Time  BP 134/84 03/22/24 08:26  Temp 97 0826  Pulse 65 03/22/24 08:28  Resp 17 03/22/24 08:28  SpO2 97 % 03/22/24 08:28  Vitals shown include unfiled device data.  Last Pain:  Vitals:   03/22/24 0642  TempSrc: Oral  PainSc: 0-No pain      Patients Stated Pain Goal: 5 (03/22/24 9357)  Complications: No notable events documented.

## 2024-03-22 NOTE — Interval H&P Note (Signed)
 History and Physical Interval Note:   He has started his antibiotic.   03/22/2024 7:08 AM  Gilbert Clark  has presented today for surgery, with the diagnosis of HISTORY OF BLADDER CANCER.  The various methods of treatment have been discussed with the patient and family. After consideration of risks, benefits and other options for treatment, the patient has consented to  Procedure(s): TURBT, WITH CHEMOTHERAPEUTIC AGENT INSTILLATION INTO BLADDER (N/A) INSTILLATION, BLADDER (N/A) CYSTOSCOPY, WITH RETROGRADE PYELOGRAM (Bilateral) as a surgical intervention.  The patient's history has been reviewed, patient examined, no change in status, stable for surgery.  I have reviewed the patient's chart and labs.  Questions were answered to the patient's satisfaction.     Naila Elizondo

## 2024-03-22 NOTE — Op Note (Signed)
 Procedure: 1.  Cystoscopy with bilateral retrograde pyelograms and interpretation. 2.  Cystoscopy with transurethral resection of bladder tumor, 2.5 cm. 3.  Application of fluoroscopy. 4.  Instillation of gemcitabine  in OR.  Pre-op diagnosis: History of bladder cancer with probable recurrence.  Postop diagnosis: Same.  Surgeon: Dr. Norleen Seltzer.  Anesthesia: General.  Specimen: 1.  Cup biopsies from bladder dome. 2.  TUR chips from left lateral wall.  Drains: 18 French Foley catheter.  EBL: None.  Complications: None.  Indications: Patient is an 88 year old male with a history of bladder cancer who was found on recent surveillance cystoscopy to have small lesions on the dome lateral wall with some early papillary formation that were concerning for recurrence.  It was felt that biopsy was indicated as well as evaluation of the upper tracts.  Procedure: He was taken the operating room was given Ancef .  He been on p.o. antibiotics preoperatively.  A general anesthetic was induced.  He was placed in lithotomy position and food PSIs.  His perineum and genitalia were prepped with Betadine solution he was draped in usual sterile fashion.  Cystoscopy was performed using the 21 French scope and 30 degree lens.  Examination revealed a normal urethra.  The external sphincter was intact.  The prostatic urethra was approximately 3 cm in length the lateral lobe enlargement with some coaptation obstruction.  There is a small middle lobe.  Examination the bladder revealed moderate trabeculation with some cellules.  The bladder mucosa was generally pale and normal in appearance but on the dome there were 2 small areas with some early papillary formation and there were some patchy areas on the left lateral wall with similar findings.  The left ureteral orifice was slightly laterally displaced from prior resection but the right ureteral orifice was normally located.  There was some scarring from prior resection  of the base of the bladder.  A cup biopsy forceps was used to biopsy the 2 areas on the dome.  The cystoscope was then removed and replaced with a 26 French continuous-flow resectoscope sheath with the aid of visual obturator.  This was then fitted with an Faustine handle using a 30 degree lens and bipolar loop.  The biopsy sites on the dome were then generously fulgurated along with the surrounding mucosa which had some erythematous changes.  This area was approximately 2 cm.  I then resected 2 areas on the left lateral wall with the largest approximately 2-1/2 cm in size and then generously fulgurated the base the resections and the surrounding mucosa particularly any that had visible erythema.  The area of fulguration was adjacent to but not involving the left ureteral orifice.  Further inspection demonstrated a small area on the anterior bladder wall that was somewhat concerning as it was fulgurated as well.  The bladder was evacuated free of chips and final inspection revealed no bladder wall perforation or bleeding.  An 36 French Foley catheter was inserted and the balloon was filled with 10 mL of sterile fluid and the bladder was drained.  The bladder was then instilled with 2000 mg of the gemcitabine  and 50 mL of diluent .  The catheter was plugged.  He was taken down from lithotomy position, his anesthetic was reversed and he was moved recovery in a stable condition.  The bladder will be drained at 910 approximately hour after instillation.

## 2024-03-22 NOTE — Anesthesia Postprocedure Evaluation (Signed)
 Anesthesia Post Note  Patient: Gilbert Clark  Procedure(s) Performed: TURBT, WITH CHEMOTHERAPEUTIC AGENT INSTILLATION INTO BLADDER INSTILLATION, BLADDER CYSTOSCOPY, WITH RETROGRADE PYELOGRAM (Bilateral)     Patient location during evaluation: PACU Anesthesia Type: General Level of consciousness: awake and alert Pain management: pain level controlled Vital Signs Assessment: post-procedure vital signs reviewed and stable Respiratory status: spontaneous breathing, nonlabored ventilation and respiratory function stable Cardiovascular status: blood pressure returned to baseline and stable Postop Assessment: no apparent nausea or vomiting Anesthetic complications: no   There were no known notable events for this encounter.  Last Vitals:  Vitals:   03/22/24 0826 03/22/24 0830  BP: (!) 148/127 131/71  Pulse: 66 65  Resp: 13 15  Temp: 36.6 C   SpO2: 97% 98%    Last Pain:  Vitals:   03/22/24 0826  TempSrc:   PainSc: 0-No pain                 Bitha Fauteux A.

## 2024-03-22 NOTE — Discharge Instructions (Addendum)
You may remove the catheter in the morning.     Post Anesthesia Home Care Instructions  Activity: Get plenty of rest for the remainder of the day. A responsible individual must stay with you for 24 hours following the procedure.  For the next 24 hours, DO NOT: -Drive a car -Paediatric nurse -Drink alcoholic beverages -Take any medication unless instructed by your physician -Make any legal decisions or sign important papers.  Meals: Start with liquid foods such as gelatin or soup. Progress to regular foods as tolerated. Avoid greasy, spicy, heavy foods. If nausea and/or vomiting occur, drink only clear liquids until the nausea and/or vomiting subsides. Call your physician if vomiting continues.  Special Instructions/Symptoms: Your throat may feel dry or sore from the anesthesia or the breathing tube placed in your throat during surgery. If this causes discomfort, gargle with warm salt water. The discomfort should disappear within 24 hours.

## 2024-03-23 ENCOUNTER — Encounter (HOSPITAL_COMMUNITY): Payer: Self-pay | Admitting: Urology

## 2024-03-25 LAB — SURGICAL PATHOLOGY

## 2024-04-26 ENCOUNTER — Other Ambulatory Visit (HOSPITAL_COMMUNITY): Payer: Self-pay | Admitting: Family Medicine

## 2024-04-26 DIAGNOSIS — F03B0 Unspecified dementia, moderate, without behavioral disturbance, psychotic disturbance, mood disturbance, and anxiety: Secondary | ICD-10-CM

## 2024-04-28 ENCOUNTER — Ambulatory Visit (HOSPITAL_COMMUNITY): Admission: RE | Admit: 2024-04-28 | Discharge: 2024-04-28 | Attending: Family Medicine | Admitting: Family Medicine

## 2024-04-28 DIAGNOSIS — F03B0 Unspecified dementia, moderate, without behavioral disturbance, psychotic disturbance, mood disturbance, and anxiety: Secondary | ICD-10-CM | POA: Insufficient documentation

## 2024-04-28 MED ORDER — GADOBUTROL 1 MMOL/ML IV SOLN
10.0000 mL | Freq: Once | INTRAVENOUS | Status: AC | PRN
  Administered 2024-04-28: 10 mL via INTRAVENOUS

## 2024-05-24 ENCOUNTER — Ambulatory Visit: Admitting: Internal Medicine

## 2024-06-10 ENCOUNTER — Ambulatory Visit: Admitting: Internal Medicine

## 2024-06-13 ENCOUNTER — Ambulatory Visit: Admitting: Internal Medicine

## 2024-09-19 ENCOUNTER — Ambulatory Visit: Admitting: Diagnostic Neuroimaging
